# Patient Record
Sex: Female | Born: 1975
Health system: Southern US, Community
[De-identification: ages and names within clinical notes are randomized; demographics above are authoritative.]

## PROBLEM LIST (undated history)

## (undated) DIAGNOSIS — E559 Vitamin D deficiency, unspecified: Secondary | ICD-10-CM

## (undated) DIAGNOSIS — I639 Cerebral infarction, unspecified: Secondary | ICD-10-CM

## (undated) DIAGNOSIS — C801 Malignant (primary) neoplasm, unspecified: Secondary | ICD-10-CM

## (undated) HISTORY — DX: Malignant (primary) neoplasm, unspecified: C80.1

## (undated) HISTORY — DX: Vitamin D deficiency, unspecified: E55.9

## (undated) HISTORY — DX: Cerebral infarction, unspecified: I63.9

---

## 2001-12-09 ENCOUNTER — Inpatient Hospital Stay (HOSPITAL_COMMUNITY): Admission: AD | Admit: 2001-12-09 | Discharge: 2001-12-09 | Payer: Self-pay | Admitting: *Deleted

## 2006-09-06 ENCOUNTER — Emergency Department (HOSPITAL_COMMUNITY): Admission: EM | Admit: 2006-09-06 | Discharge: 2006-09-06 | Payer: Self-pay | Admitting: Emergency Medicine

## 2007-02-28 ENCOUNTER — Observation Stay: Payer: Self-pay

## 2007-03-01 ENCOUNTER — Observation Stay: Payer: Self-pay | Admitting: Obstetrics and Gynecology

## 2007-03-13 ENCOUNTER — Ambulatory Visit: Payer: Self-pay | Admitting: Obstetrics and Gynecology

## 2007-03-26 ENCOUNTER — Ambulatory Visit: Payer: Self-pay

## 2007-06-11 ENCOUNTER — Observation Stay: Payer: Self-pay | Admitting: Obstetrics and Gynecology

## 2007-06-26 ENCOUNTER — Inpatient Hospital Stay: Payer: Self-pay

## 2008-12-26 ENCOUNTER — Encounter: Admission: RE | Admit: 2008-12-26 | Discharge: 2008-12-26 | Payer: Self-pay | Admitting: Family Medicine

## 2009-01-01 ENCOUNTER — Encounter: Admission: RE | Admit: 2009-01-01 | Discharge: 2009-01-01 | Payer: Self-pay | Admitting: Family Medicine

## 2009-02-05 ENCOUNTER — Encounter (INDEPENDENT_AMBULATORY_CARE_PROVIDER_SITE_OTHER): Payer: Self-pay | Admitting: Surgery

## 2009-02-05 ENCOUNTER — Ambulatory Visit (HOSPITAL_COMMUNITY): Admission: RE | Admit: 2009-02-05 | Discharge: 2009-02-06 | Payer: Self-pay | Admitting: Surgery

## 2011-01-04 LAB — COMPREHENSIVE METABOLIC PANEL
ALT: 14 U/L (ref 0–35)
BUN: 12 mg/dL (ref 6–23)
CO2: 28 mEq/L (ref 19–32)
GFR calc Af Amer: >60 mL/min (ref >60)
GFR calc non Af Amer: >60 mL/min (ref >60)
Glucose, Bld: 83 mg/dL (ref 70–99)
Potassium: 4.3 mEq/L (ref 3.5–5.1)
Sodium: 141 mEq/L (ref 135–145)
Total Bilirubin: 1 mg/dL (ref 0.3–1.2)

## 2011-01-04 LAB — PREGNANCY, URINE: Preg Test, Ur: NEGATIVE

## 2011-01-04 LAB — DIFFERENTIAL
Lymphocytes Relative: 20 % (ref 12–46)
Lymphs Abs: 2.2 10*3/uL (ref 0.7–4.0)
Monocytes Absolute: 0.5 10*3/uL (ref 0.1–1.0)
Neutro Abs: 8.3 10*3/uL — ABNORMAL HIGH (ref 1.7–7.7)

## 2011-01-04 LAB — CBC
MCHC: 33.8 g/dL (ref 30.0–36.0)
MCV: 89.7 fL (ref 78.0–100.0)
Platelets: 204 10*3/uL (ref 150–400)

## 2011-01-12 ENCOUNTER — Other Ambulatory Visit: Payer: Self-pay | Admitting: Family Medicine

## 2011-01-12 DIAGNOSIS — R51 Headache: Secondary | ICD-10-CM

## 2011-02-08 NOTE — Op Note (Signed)
Vanessa Sharp, Vanessa Sharp             ACCOUNT NO.:  [id number]   MEDICAL RECORD NO.:  [id number]          PATIENT TYPE:  AMB   LOCATION:  DAY                          FACILITY:  [facility]   PHYSICIAN:  Sandria Bales. Ezzard Standing, M.D.  DATE OF BIRTH:  1976-06-15   DATE OF PROCEDURE:  02/05/2009  DATE OF DISCHARGE:                               OPERATIVE REPORT   Date of Surgery - 05 Feb 2009   PREOPERATIVE DIAGNOSES:  Chronic cholecystitis, cholelithiasis.   POSTOPERATIVE DIAGNOSES:  Chronic cholecystitis, cholelithiasis.   PROCEDURE:  Laparoscopic cholecystectomy with intraoperative  cholangiogram.   SURGEON  Ovidio Kin, M.D.   FIRST ASSISTANT:  Anselm Pancoast. Zachery Dakins, M.D.   ANESTHESIA:  General endotracheal.   ESTIMATED BLOOD LOSS:  Minimal.   INDICATIONS FOR PROCEDURE:  Ms. Minardi is a 35 year old white female  patient of Dr. Levonne Lapping who has had some vague epigastric abdominal  pain.  She had an ultrasound which showed multiple gallstones with the  largest being at least 1.5 cm.  I discussed with her about proceeding  with cholecystectomy and the indications and potential complications.   The potential complications of cholecystectomy include bleeding,  infection, bile duct injury, the possibility of open surgery and the  possibility her symptoms may continue postoperative.   OPERATIVE NOTE:  The patient was placed in the supine position and given  a general endotracheal anesthetic.  Her abdomen was prepped with  DuraPrep and sterilely draped.  She was given 1 gram of Ancef at  initiation of the procedure.  A time-out was held identifying the  patient and procedure.   I got to her intra-abdominal cavity using a 0 degree 10 mm laparoscope  through a 12 mm Hasson trocar secured with a 0 Vicryl suture at the  infraumbilical area.  I put a 10 mm trocar in the subxiphoid location, a  5 -mm right mid subcostal and a 5 mm lateral subcostal.   The gallbladder was grabbed, rotated  cephalad and dissection carried out  identifying the cystic duct and cystic artery.  The cystic artery was  doubly endoclipped and divided.  A clip was placed on the gallbladder  side of the cystic duct.  The triangle of Calot was visualized.   An intraoperative cholangiogram was shot using a cutoff taut catheter  inserted through a 14 inch Jelco catheter and into the side of the  cystic duct and secured with an Endo clip.  I used about 12 mL of half-  strength Hypaque solution showing contrast going down the cystic duct  into the common bile duct, into the duodenum, up the hepatic radicals.  There was no filling defect, no mass or obstruction and it was felt to  be a normal intraoperative cholangiogram.   The taut catheter was then removed.  The cystic duct was triply  endoclipped and divided.  Prior to complete division of the gallbladder  from the gallbladder bed, I revisualized the triangle of Calot.  There  was no bleeding or bile leak and then I used a hook Bovie to dissect the  gallbladder off the gallbladder bed.  After the gallbladder had been  dissected away, I placed it in an EndoCatch bag and delivered it through  the umbilicus.  I then reinspected the triangle of Calot, I reinspected  the gallbladder bed and again there was no bleeding or problem.   The patient tolerated the procedure well and was transported to the  recovery room in good condition.  Sponge and needle counts were correct  at the end of the case.      Sandria Bales. Ezzard Standing, M.D.  Electronically Signed     DHN/MEDQ  D:  02/05/2009  T:  02/05/2009  Job:  [id number]   cc:   Chales Salmon. Abigail Miyamoto, M.D.  Fax: [phone]   Madelin Headings  Fax: [phone]

## 2014-11-24 ENCOUNTER — Other Ambulatory Visit: Payer: Self-pay | Admitting: Physician Assistant

## 2014-11-25 ENCOUNTER — Other Ambulatory Visit: Payer: Self-pay | Admitting: Physician Assistant

## 2014-11-25 DIAGNOSIS — N644 Mastodynia: Secondary | ICD-10-CM

## 2014-11-26 ENCOUNTER — Other Ambulatory Visit: Payer: Self-pay | Admitting: Physician Assistant

## 2014-11-26 ENCOUNTER — Other Ambulatory Visit (HOSPITAL_COMMUNITY)
Admission: RE | Admit: 2014-11-26 | Discharge: 2014-11-26 | Disposition: A | Discharge status: Home / Self Care | Payer: BC Managed Care – PPO | Source: Ambulatory Visit | Attending: Physician Assistant | Admitting: Physician Assistant

## 2014-11-26 DIAGNOSIS — N644 Mastodynia: Secondary | ICD-10-CM

## 2014-12-04 ENCOUNTER — Ambulatory Visit
Admission: RE | Admit: 2014-12-04 | Discharge: 2014-12-04 | Disposition: A | Discharge status: Home / Self Care | Payer: BC Managed Care – PPO | Source: Ambulatory Visit | Attending: Physician Assistant | Admitting: Physician Assistant

## 2014-12-04 DIAGNOSIS — N644 Mastodynia: Secondary | ICD-10-CM

## 2014-12-15 ENCOUNTER — Other Ambulatory Visit: Payer: Self-pay | Admitting: Physician Assistant

## 2014-12-15 DIAGNOSIS — Z124 Encounter for screening for malignant neoplasm of cervix: Secondary | ICD-10-CM | POA: Diagnosis present

## 2014-12-15 DIAGNOSIS — R87810 Cervical high risk human papillomavirus (HPV) DNA test positive: Secondary | ICD-10-CM | POA: Insufficient documentation

## 2014-12-15 DIAGNOSIS — Z1151 Encounter for screening for human papillomavirus (HPV): Secondary | ICD-10-CM | POA: Diagnosis present

## 2014-12-16 LAB — CYTOLOGY - PAP

## 2015-01-15 ENCOUNTER — Other Ambulatory Visit: Payer: Self-pay | Admitting: Family Medicine

## 2016-02-18 ENCOUNTER — Other Ambulatory Visit (HOSPITAL_COMMUNITY)
Admission: RE | Admit: 2016-02-18 | Discharge: 2016-02-18 | Disposition: A | Discharge status: Home / Self Care | Payer: BC Managed Care – PPO | Source: Ambulatory Visit | Attending: Physician Assistant | Admitting: Physician Assistant

## 2016-02-18 ENCOUNTER — Other Ambulatory Visit: Payer: Self-pay | Admitting: Physician Assistant

## 2016-02-18 DIAGNOSIS — Z01411 Encounter for gynecological examination (general) (routine) with abnormal findings: Secondary | ICD-10-CM | POA: Diagnosis present

## 2016-02-18 DIAGNOSIS — Z1151 Encounter for screening for human papillomavirus (HPV): Secondary | ICD-10-CM | POA: Insufficient documentation

## 2016-02-24 LAB — CYTOLOGY - PAP

## 2016-03-23 ENCOUNTER — Other Ambulatory Visit: Payer: Self-pay | Admitting: Family Medicine

## 2016-11-11 MED FILL — OSELTAMIVIR PHOSPHATE 75 MG: 75 | 10 days supply | Qty: 10 | Fill #0

## 2017-04-28 ENCOUNTER — Other Ambulatory Visit: Payer: Self-pay | Admitting: Physician Assistant

## 2017-04-28 ENCOUNTER — Other Ambulatory Visit (HOSPITAL_COMMUNITY)
Admission: RE | Admit: 2017-04-28 | Discharge: 2017-04-28 | Disposition: A | Discharge status: Home / Self Care | Payer: BC Managed Care – PPO | Source: Ambulatory Visit | Attending: Physician Assistant | Admitting: Physician Assistant

## 2017-04-28 DIAGNOSIS — Z124 Encounter for screening for malignant neoplasm of cervix: Secondary | ICD-10-CM | POA: Insufficient documentation

## 2017-05-03 LAB — CYTOLOGY - PAP: HPV (WINDOPATH): DETECTED — AB

## 2017-05-10 ENCOUNTER — Other Ambulatory Visit: Payer: Self-pay | Admitting: Family Medicine

## 2017-05-10 DIAGNOSIS — Z1231 Encounter for screening mammogram for malignant neoplasm of breast: Secondary | ICD-10-CM

## 2017-05-22 ENCOUNTER — Ambulatory Visit
Admission: RE | Admit: 2017-05-22 | Discharge: 2017-05-22 | Disposition: A | Discharge status: Home / Self Care | Payer: BC Managed Care – PPO | Source: Ambulatory Visit | Attending: Family Medicine | Admitting: Family Medicine

## 2017-05-22 DIAGNOSIS — Z1231 Encounter for screening mammogram for malignant neoplasm of breast: Secondary | ICD-10-CM

## 2017-05-23 ENCOUNTER — Other Ambulatory Visit: Payer: Self-pay | Admitting: Family Medicine

## 2017-09-21 HISTORY — PX: ABDOMINAL HYSTERECTOMY: SHX81

## 2018-05-09 DIAGNOSIS — Z8541 Personal history of malignant neoplasm of cervix uteri: Secondary | ICD-10-CM | POA: Insufficient documentation

## 2018-08-10 ENCOUNTER — Other Ambulatory Visit: Payer: Self-pay | Admitting: Family Medicine

## 2018-08-10 DIAGNOSIS — Z1231 Encounter for screening mammogram for malignant neoplasm of breast: Secondary | ICD-10-CM

## 2018-09-24 ENCOUNTER — Encounter: Payer: Self-pay | Admitting: Radiology

## 2018-09-24 ENCOUNTER — Ambulatory Visit
Admission: RE | Admit: 2018-09-24 | Discharge: 2018-09-24 | Disposition: A | Discharge status: Home / Self Care | Payer: BC Managed Care – PPO | Source: Ambulatory Visit | Attending: Family Medicine | Admitting: Family Medicine

## 2018-09-24 DIAGNOSIS — Z1231 Encounter for screening mammogram for malignant neoplasm of breast: Secondary | ICD-10-CM

## 2018-11-08 ENCOUNTER — Emergency Department: Payer: BC Managed Care – PPO

## 2018-11-08 ENCOUNTER — Encounter: Payer: Self-pay | Admitting: Medical Oncology

## 2018-11-08 ENCOUNTER — Inpatient Hospital Stay (HOSPITAL_COMMUNITY): Payer: BC Managed Care – PPO | Admitting: Certified Registered Nurse Anesthetist

## 2018-11-08 ENCOUNTER — Inpatient Hospital Stay (HOSPITAL_COMMUNITY)
Admission: AD | Admit: 2018-11-08 | Discharge: 2018-11-11 | DRG: 023 | Disposition: A | Discharge status: Home / Self Care | Payer: BC Managed Care – PPO | Source: Other Acute Inpatient Hospital | Attending: Neurology | Admitting: Neurology

## 2018-11-08 ENCOUNTER — Emergency Department
Admission: EM | Admit: 2018-11-08 | Discharge: 2018-11-08 | Disposition: A | Discharge status: Other Acute Inpatient Hospital | Payer: BC Managed Care – PPO | Attending: Emergency Medicine | Admitting: Emergency Medicine

## 2018-11-08 ENCOUNTER — Inpatient Hospital Stay (HOSPITAL_COMMUNITY): Payer: BC Managed Care – PPO

## 2018-11-08 ENCOUNTER — Encounter (HOSPITAL_COMMUNITY)
Admission: AD | Disposition: A | Discharge status: Home / Self Care | Payer: Self-pay | Source: Other Acute Inpatient Hospital | Attending: Neurology

## 2018-11-08 DIAGNOSIS — Z79899 Other long term (current) drug therapy: Secondary | ICD-10-CM | POA: Insufficient documentation

## 2018-11-08 DIAGNOSIS — R4701 Aphasia: Secondary | ICD-10-CM | POA: Diagnosis present

## 2018-11-08 DIAGNOSIS — I7771 Dissection of carotid artery: Secondary | ICD-10-CM

## 2018-11-08 DIAGNOSIS — I959 Hypotension, unspecified: Secondary | ICD-10-CM | POA: Diagnosis present

## 2018-11-08 DIAGNOSIS — I63512 Cerebral infarction due to unspecified occlusion or stenosis of left middle cerebral artery: Principal | ICD-10-CM | POA: Diagnosis present

## 2018-11-08 DIAGNOSIS — I6602 Occlusion and stenosis of left middle cerebral artery: Secondary | ICD-10-CM | POA: Diagnosis not present

## 2018-11-08 DIAGNOSIS — E876 Hypokalemia: Secondary | ICD-10-CM | POA: Diagnosis not present

## 2018-11-08 DIAGNOSIS — I639 Cerebral infarction, unspecified: Secondary | ICD-10-CM

## 2018-11-08 DIAGNOSIS — R29702 NIHSS score 2: Secondary | ICD-10-CM | POA: Diagnosis not present

## 2018-11-08 DIAGNOSIS — I6522 Occlusion and stenosis of left carotid artery: Secondary | ICD-10-CM | POA: Diagnosis present

## 2018-11-08 DIAGNOSIS — Z90710 Acquired absence of both cervix and uterus: Secondary | ICD-10-CM

## 2018-11-08 DIAGNOSIS — E785 Hyperlipidemia, unspecified: Secondary | ICD-10-CM | POA: Diagnosis present

## 2018-11-08 DIAGNOSIS — G8321 Monoplegia of upper limb affecting right dominant side: Secondary | ICD-10-CM | POA: Diagnosis present

## 2018-11-08 DIAGNOSIS — Z803 Family history of malignant neoplasm of breast: Secondary | ICD-10-CM | POA: Diagnosis not present

## 2018-11-08 DIAGNOSIS — Z888 Allergy status to other drugs, medicaments and biological substances status: Secondary | ICD-10-CM | POA: Diagnosis not present

## 2018-11-08 DIAGNOSIS — D72829 Elevated white blood cell count, unspecified: Secondary | ICD-10-CM | POA: Diagnosis not present

## 2018-11-08 DIAGNOSIS — G43909 Migraine, unspecified, not intractable, without status migrainosus: Secondary | ICD-10-CM | POA: Diagnosis present

## 2018-11-08 DIAGNOSIS — R29704 NIHSS score 4: Secondary | ICD-10-CM | POA: Diagnosis present

## 2018-11-08 DIAGNOSIS — Z9282 Status post administration of tPA (rtPA) in a different facility within the last 24 hours prior to admission to current facility: Secondary | ICD-10-CM | POA: Diagnosis not present

## 2018-11-08 DIAGNOSIS — Z8541 Personal history of malignant neoplasm of cervix uteri: Secondary | ICD-10-CM

## 2018-11-08 DIAGNOSIS — R0683 Snoring: Secondary | ICD-10-CM | POA: Diagnosis present

## 2018-11-08 DIAGNOSIS — Z4659 Encounter for fitting and adjustment of other gastrointestinal appliance and device: Secondary | ICD-10-CM

## 2018-11-08 DIAGNOSIS — Z885 Allergy status to narcotic agent status: Secondary | ICD-10-CM | POA: Diagnosis not present

## 2018-11-08 DIAGNOSIS — Z79890 Hormone replacement therapy (postmenopausal): Secondary | ICD-10-CM | POA: Diagnosis not present

## 2018-11-08 HISTORY — PX: RADIOLOGY WITH ANESTHESIA: SHX6223

## 2018-11-08 LAB — COMPREHENSIVE METABOLIC PANEL
ALT: 15 U/L (ref 0–44)
AST: 19 U/L (ref 15–41)
Albumin: 4.8 g/dL (ref 3.5–5.0)
Alkaline Phosphatase: 53 U/L (ref 38–126)
Anion gap: 6 (ref 5–15)
BILIRUBIN TOTAL: 0.6 mg/dL (ref 0.3–1.2)
BUN: 15 mg/dL (ref 6–20)
CO2: 28 mmol/L (ref 22–32)
Calcium: 9.3 mg/dL (ref 8.9–10.3)
Chloride: 105 mmol/L (ref 98–111)
Creatinine, Ser: 0.64 mg/dL (ref 0.44–1.00)
GFR calc Af Amer: >60 mL/min (ref >60)
GFR calc non Af Amer: >60 mL/min (ref >60)
Glucose, Bld: 89 mg/dL (ref 70–99)
Potassium: 4.1 mmol/L (ref 3.5–5.1)
Sodium: 139 mmol/L (ref 135–145)
Total Protein: 7.4 g/dL (ref 6.5–8.1)

## 2018-11-08 LAB — DIFFERENTIAL
Abs Immature Granulocytes: 0.02 10*3/uL (ref 0.00–0.07)
Basophils Absolute: 0.1 10*3/uL (ref 0.0–0.1)
Basophils Relative: 1 %
Eosinophils Absolute: 0.2 10*3/uL (ref 0.0–0.5)
Eosinophils Relative: 2 %
Immature Granulocytes: 0 %
Lymphocytes Relative: 23 %
Lymphs Abs: 2.2 10*3/uL (ref 0.7–4.0)
MONOS PCT: 6 %
Monocytes Absolute: 0.6 10*3/uL (ref 0.1–1.0)
Neutro Abs: 6.3 10*3/uL (ref 1.7–7.7)
Neutrophils Relative %: 68 %

## 2018-11-08 LAB — CBC
HCT: 46 % (ref 36.0–46.0)
Hemoglobin: 15.1 g/dL — ABNORMAL HIGH (ref 12.0–15.0)
MCH: 29.2 pg (ref 26.0–34.0)
MCHC: 32.8 g/dL (ref 30.0–36.0)
MCV: 88.8 fL (ref 80.0–100.0)
Platelets: 221 10*3/uL (ref 150–400)
RBC: 5.18 MIL/uL — ABNORMAL HIGH (ref 3.87–5.11)
RDW: 12.5 % (ref 11.5–15.5)
WBC: 9.3 10*3/uL (ref 4.0–10.5)
nRBC: 0 % (ref 0.0–0.2)

## 2018-11-08 LAB — PROTIME-INR
INR: 1.01
Prothrombin Time: 13.2 seconds (ref 11.4–15.2)

## 2018-11-08 LAB — APTT: aPTT: 32 seconds (ref 24–36)

## 2018-11-08 LAB — GLUCOSE, CAPILLARY: Glucose-Capillary: 75 mg/dL (ref 70–99)

## 2018-11-08 SURGERY — IR WITH ANESTHESIA
Anesthesia: General

## 2018-11-08 MED ORDER — ALTEPLASE 100 MG IV SOLR
INTRAVENOUS | Status: AC
  Filled 2018-11-08: qty 100

## 2018-11-08 MED ORDER — VECURONIUM BROMIDE 10 MG IV SOLR
INTRAVENOUS | Status: DC | PRN
  Administered 2018-11-08: 2 mg via INTRAVENOUS

## 2018-11-08 MED ORDER — EPTIFIBATIDE 20 MG/10ML IV SOLN
INTRAVENOUS | Status: AC | PRN
  Administered 2018-11-08 (×3): 1.5 mg via INTRAVENOUS

## 2018-11-08 MED ORDER — PROPOFOL 10 MG/ML IV BOLUS
INTRAVENOUS | Status: DC | PRN
  Administered 2018-11-08: 160 mg via INTRAVENOUS

## 2018-11-08 MED ORDER — ASPIRIN 81 MG PO CHEW
CHEWABLE_TABLET | ORAL | Status: AC | PRN
  Administered 2018-11-08: 81 mg

## 2018-11-08 MED ORDER — TICAGRELOR 60 MG PO TABS
ORAL_TABLET | ORAL | Status: AC | PRN
  Administered 2018-11-08: 180 mg

## 2018-11-08 MED ORDER — CEFAZOLIN SODIUM-DEXTROSE 2-4 GM/100ML-% IV SOLN
INTRAVENOUS | Status: AC
  Filled 2018-11-08: qty 100

## 2018-11-08 MED ORDER — TIROFIBAN HCL IN NACL 5-0.9 MG/100ML-% IV SOLN
INTRAVENOUS | Status: AC
  Filled 2018-11-08: qty 100

## 2018-11-08 MED ORDER — EPTIFIBATIDE 20 MG/10ML IV SOLN
INTRAVENOUS | Status: AC
  Filled 2018-11-08: qty 10

## 2018-11-08 MED ORDER — NITROGLYCERIN 1 MG/10 ML FOR IR/CATH LAB
INTRA_ARTERIAL | Status: AC
  Filled 2018-11-08: qty 10

## 2018-11-08 MED ORDER — IOPAMIDOL (ISOVUE-370) INJECTION 76%
50.0000 mL | Freq: Once | INTRAVENOUS | Status: AC | PRN
  Administered 2018-11-08: 50 mL via INTRAVENOUS

## 2018-11-08 MED ORDER — PHENYLEPHRINE HCL 10 MG/ML IJ SOLN
INTRAMUSCULAR | Status: DC | PRN
  Administered 2018-11-08: 40 ug via INTRAVENOUS

## 2018-11-08 MED ORDER — ASPIRIN 325 MG PO TABS
ORAL_TABLET | ORAL | Status: AC
  Filled 2018-11-08: qty 1

## 2018-11-08 MED ORDER — ALTEPLASE (STROKE) FULL DOSE INFUSION
0.9000 mg/kg | Freq: Once | INTRAVENOUS | Status: AC
  Administered 2018-11-08: 71.9 mg via INTRAVENOUS
  Filled 2018-11-08: qty 100

## 2018-11-08 MED ORDER — LIDOCAINE HCL 1 % IJ SOLN
INTRAMUSCULAR | Status: AC
  Filled 2018-11-08: qty 20

## 2018-11-08 MED ORDER — FENTANYL CITRATE (PF) 100 MCG/2ML IJ SOLN
INTRAMUSCULAR | Status: AC
  Filled 2018-11-08: qty 2

## 2018-11-08 MED ORDER — LACTATED RINGERS IV SOLN
INTRAVENOUS | Status: DC | PRN
  Administered 2018-11-08 (×2): via INTRAVENOUS

## 2018-11-08 MED ORDER — ACETAMINOPHEN 160 MG/5ML PO SOLN: 650.0000 mg | ORAL | Status: DC | PRN

## 2018-11-08 MED ORDER — SODIUM CHLORIDE 0.9 % IV SOLN: INTRAVENOUS | Status: DC

## 2018-11-08 MED ORDER — SODIUM CHLORIDE 0.9 % IV SOLN
INTRAVENOUS | Status: DC | PRN
  Administered 2018-11-08: 25 ug/min via INTRAVENOUS

## 2018-11-08 MED ORDER — SODIUM CHLORIDE (PF) 0.9 % IJ SOLN
INTRAVENOUS | Status: AC | PRN
  Administered 2018-11-08 – 2018-11-09 (×7): 25 ug via INTRA_ARTERIAL

## 2018-11-08 MED ORDER — IOHEXOL 350 MG/ML SOLN
75.0000 mL | Freq: Once | INTRAVENOUS | Status: AC | PRN
  Administered 2018-11-08: 75 mL via INTRAVENOUS

## 2018-11-08 MED ORDER — CLOPIDOGREL BISULFATE 300 MG PO TABS
ORAL_TABLET | ORAL | Status: AC
  Filled 2018-11-08: qty 1

## 2018-11-08 MED ORDER — ACETAMINOPHEN 650 MG RE SUPP: 650.0000 mg | RECTAL | Status: DC | PRN

## 2018-11-08 MED ORDER — SUCCINYLCHOLINE 20MG/ML (10ML) SYRINGE FOR MEDFUSION PUMP - OPTIME
INTRAMUSCULAR | Status: DC | PRN
  Administered 2018-11-08: 140 mg via INTRAVENOUS

## 2018-11-08 MED ORDER — ACETAMINOPHEN 325 MG PO TABS: 650.0000 mg | ORAL_TABLET | ORAL | Status: DC | PRN

## 2018-11-08 MED ORDER — CEFAZOLIN SODIUM-DEXTROSE 2-3 GM-%(50ML) IV SOLR
INTRAVENOUS | Status: DC | PRN
  Administered 2018-11-08: 2 g via INTRAVENOUS

## 2018-11-08 MED ORDER — SODIUM CHLORIDE 0.9% FLUSH: 3.0000 mL | Freq: Once | INTRAVENOUS | Status: DC

## 2018-11-08 MED ORDER — SODIUM CHLORIDE 0.9 % IV SOLN
50.0000 mL | Freq: Once | INTRAVENOUS | Status: AC
  Administered 2018-11-08: 50 mL via INTRAVENOUS

## 2018-11-08 MED ORDER — TICAGRELOR 90 MG PO TABS
ORAL_TABLET | ORAL | Status: AC
  Filled 2018-11-08: qty 2

## 2018-11-08 MED ORDER — STROKE: EARLY STAGES OF RECOVERY BOOK
Freq: Once | Status: DC
  Filled 2018-11-08 (×2): qty 1

## 2018-11-08 MED ORDER — PANTOPRAZOLE SODIUM 40 MG IV SOLR
40.0000 mg | Freq: Every day | INTRAVENOUS | Status: DC
  Administered 2018-11-09: 40 mg via INTRAVENOUS
  Filled 2018-11-08: qty 40

## 2018-11-08 MED ORDER — ASPIRIN 81 MG PO CHEW
CHEWABLE_TABLET | ORAL | Status: AC
  Filled 2018-11-08: qty 1

## 2018-11-08 MED ORDER — ALTEPLASE (STROKE) FULL DOSE INFUSION: 0.9000 mg/kg | Freq: Once | INTRAVENOUS | Status: DC

## 2018-11-08 MED ORDER — SENNOSIDES-DOCUSATE SODIUM 8.6-50 MG PO TABS: 1.0000 | ORAL_TABLET | Freq: Every evening | ORAL | Status: DC | PRN

## 2018-11-08 MED ORDER — FENTANYL CITRATE (PF) 100 MCG/2ML IJ SOLN
INTRAMUSCULAR | Status: DC | PRN
  Administered 2018-11-08: 100 ug via INTRAVENOUS
  Administered 2018-11-09: 50 ug via INTRAVENOUS

## 2018-11-08 MED ORDER — ROCURONIUM 10MG/ML (10ML) SYRINGE FOR MEDFUSION PUMP - OPTIME
INTRAVENOUS | Status: DC | PRN
  Administered 2018-11-08: 50 mg via INTRAVENOUS
  Administered 2018-11-08: 10 mg via INTRAVENOUS
  Administered 2018-11-08: 20 mg via INTRAVENOUS
  Administered 2018-11-08 (×2): 10 mg via INTRAVENOUS

## 2018-11-08 NOTE — ED Notes (Signed)
ED Provider at bedside. 

## 2018-11-08 NOTE — ED Notes (Signed)
Dr. Denzil Magnuson on screen at this time.

## 2018-11-08 NOTE — ED Notes (Signed)
tPa Bolus completed.

## 2018-11-08 NOTE — ED Triage Notes (Signed)
Pt to ED from home with husband who reports when he arrived home around 1600 pt was unable to speak and was confused. Pt last spoke to husband on the phone at 1300 with normal speech and A/O. Pt was able to tell me her first name in triage however slurred, pt was unable to answer any other questions. Follows commands. Grip strengths weak bilaterally.

## 2018-11-08 NOTE — ED Provider Notes (Signed)
[facility] Emergency Department Provider Note ____________________________________________   I have reviewed the triage vital signs and the nursing notes.   HISTORY  Chief Complaint Aphasia   History limited by and level 5 caveat due to aphasia, most history obtained from husband  HPI Vanessa Sharp is a 43 y.o. female who presents to the emergency department today because of concern for aphagia. The patient was last spoken to by the husband around 1 pm today.  Husband noticed when he talk to her later in the day that she was having trouble speaking.  By the time of my exam the husband states that her speech has improved although patient is still not at baseline.  Patient herself denies any weakness in her extremities.  Denies any headaches.   History reviewed. No pertinent past medical history.  There are no active problems to display for this patient.   Prior to Admission medications   Medication Sig Start Date End Date Taking? Authorizing Provider  Cholecalciferol (VITAMIN D-1000 MAX ST) 25 MCG (1000 UT) tablet Take 1,000 mg by mouth daily.   Yes [provider]  ferrous sulfate 325 (65 FE) MG tablet Take 325 mg by mouth daily.   Yes [provider]  Melatonin 10 MG CAPS Take 10 mg by mouth Nightly.   Yes [provider]  Multiple Vitamin (MULTI-VITAMIN DAILY) TABS Take 1 tablet by mouth daily.   Yes [provider]  rizatriptan (MAXALT) 10 MG tablet Take 10 mg by mouth once as needed. 05/29/17  Yes [provider]    Allergies Hydrocodone and Pseudoephedrine hcl  Family History  Problem Relation Age of Onset  . Breast cancer Mother     Social History Social History   Tobacco Use  . Smoking status: Not on file  Substance Use Topics  . Alcohol use: Not on file  . Drug use: Not on file    Review of Systems Unable to obtain full ROS secondary to aphasia. Patient denies focal extremity  weakness.  ____________________________________________   PHYSICAL EXAM:  VITAL SIGNS: ED Triage Vitals [11/08/18 1640]  Enc Vitals Group     BP 139/79     Pulse Rate 84     Resp 16     Temp 98.2 F (36.8 C)     Temp Source Oral     SpO2 100 %   Constitutional: Awake and alert. Aphasic.  Eyes: Conjunctivae are normal.  ENT      Head: Normocephalic and atraumatic.      Nose: No congestion/rhinnorhea.      Mouth/Throat: Mucous membranes are moist.      Neck: No stridor. Cardiovascular: Normal rate, regular rhythm. Respiratory: Normal respiratory effort without tachypnea nor retractions.  Genitourinary: Deferred Musculoskeletal: Normal range of motion in all extremities.  Neurologic:  Aphagia.  Skin:  Skin is warm, dry and intact. No rash noted. 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   ____________________________________________   PROCEDURES  Procedures  CRITICAL CARE Performed by: Nance Pear   Total critical care time: 45 minutes  Critical care time was exclusive of separately billable procedures and treating other patients.  Critical care was necessary to treat or prevent imminent or life-threatening  deterioration.  Critical care was time spent personally by me on the following activities: development of treatment plan with patient and/or surrogate as well as nursing, discussions with consultants, evaluation of patient's response to treatment, examination of patient, obtaining history from patient or surrogate, ordering and performing treatments and interventions, ordering and review of laboratory studies, ordering and review of radiographic studies, pulse oximetry and re-evaluation of patient's condition.  ____________________________________________   INITIAL IMPRESSION / ASSESSMENT AND PLAN / ED COURSE  Pertinent labs & imaging results that were available during my care of the patient were reviewed by me and considered in my medical decision making (see chart for details).   Patient presented to the emergency department today, to by husband because of concerns for aphasia.  Patient last known normal was around 1:00.  Patient was called a code stroke.  Tele-neurology did evaluate the patient.  Head CT did come back concerning for acute left sided MCA stroke.  Tele-neurologist had a discussion with the patient and family as did I about risks and benefits of TPA.  Family elected to proceed with TPA.  Patient then had CT angios performed which was concerning for ICA dissection and M2 occlusion.  Discussed this with Dr. Rory Percy with neurology at [place].  Decision was made to transfer patient to [facility] for possible thrombectomy.  Discussed this plan with the patient and family.    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 Any transcriptional errors that result from this process are unintentional     Nance Pear, MD 11/09/18 1457

## 2018-11-08 NOTE — ED Notes (Signed)
This RN attempted to call report to interventional radiology with no answer. This RN was forwarded to voicemail and unable to reach RN to give report. [facility] has left at this time with patient. [facility] RN given report. Pt belongings sent with family member.

## 2018-11-08 NOTE — ED Notes (Signed)
tPa bolus initiated

## 2018-11-08 NOTE — ED Notes (Signed)
EMTALA reviewed by this RN.  

## 2018-11-08 NOTE — Progress Notes (Signed)
CODE STROKE- PHARMACY COMMUNICATION   Time CODE STROKE called/page received: 1641  Time response to CODE STROKE was made (in person or via phone):   Time Stroke Kit retrieved from [place] (only if needed): ~1700  Name of Provider/Nurse contacted: Dr. Archie Balboa and RN.  Assitied RN with mixing medication and discussion of expected dosing and administration.  No past medical history on file. Prior to Admission medications   Not on [place] ,PharmD Clinical Pharmacist  11/08/2018  4:44 PM

## 2018-11-08 NOTE — ED Notes (Signed)
Stroke swallow screen unable to be completed at this time. Pt to be kept NPO per Dr. Archie Balboa at this time.

## 2018-11-08 NOTE — ED Notes (Signed)
Teleneuro nurse on screen at this time. 

## 2018-11-08 NOTE — Consult Note (Addendum)
[facility] TeleNeurology Consult Services   Date of Service:   11/08/2018 16:53:46  Impression:     .  RO Acute Ischemic Stroke  Comments: acute onset isolated aphasia - LSN at approx 1300. Initial head CT read as ASPECTS of 6, but there is no extensive changes of clear hypoattenuation on  CT. The risks/benefits/alternatives of IV tpa, including a 6 percent risk of brain bleed and 3 percent risk of death, was reviewed with the patient's husband and mother, and they consented to IV tpa.  Mechanism of Stroke: Possible Thromboembolic Possible Cardioembolic Small Vessel Disease  Metrics: Last Known Well: 11/08/2018 13:00:00 [facility] Notification Time: 11/08/2018 16:53:46 Arrival Time: 11/08/2018 16:34:00 Stamp Time: 11/08/2018 16:53:46 Time First Login Attempt: 11/08/2018 17:01:37 Video Start Time: 11/08/2018 17:01:37  Symptoms: difficulty speaking NIHSS Start Assessment Time: 11/08/2018 17:05:22 tPA Verbal Order Time: 11/08/2018 17:05:38 Patient is a candidate for tPA. tPA CPOE Order Time: 11/08/2018 17:26:12 Needle Time: 11/08/2018 17:27:25 Weight Noted by Staff: 79.9 kg Video End Time: 11/08/2018 17:31:11  ED Physician notified of diagnostic impression and management plan on 11/08/2018 17:31:02  Verbal Consent to tPA: I have explained to the Family the nature of the patient's condition, the use of tPA fibrinolytic agent, and the benefits to be reasonably expected compared with alternative approaches. I have discussed the likelihood of major risks or complications of this procedure including (if applicable) but not limited to loss of limb function, brain damage, paralysis, hemorrhage, infection, complications from transfusion of blood components, drug reactions, blood clots and loss of life. I have also indicated that with any procedure there is always the possibility of an unexpected complication. All questions were answered and Family express  understanding of the treatment plan and consent to the treatment.  Our recommendations are outlined below.  Recommendations: IV tPA recommended.  tPA bolus given Without Complication.   IV tPA Total Dose - 71.9 mg IV tPA Bolus Dose - 7.2 mg IV tPA Infusion Dose - 64.7 mg  Routine post tPA monitoring including neuro checks and blood pressure control during/after treatment Monitor blood pressure Check blood pressure and NIHSS every 15 min for 2 h, then every 30 min for 6 h, and finally every hour for 16 h.  Manage Blood Pressure per post tPA protocol.      .  Admission to ICU     .  CT brain 24 hours post tPA     .  NPO until swallowing screen performed and passed     .  No antiplatelet agents or anticoagulants (including heparin for DVT prophylaxis) in first 24 hours     .  No Foley catheter, nasogastric tube, arterial catheter or central venous catheter for 24 hr, unless absolutely necessary     .  Telemetry     .  Bedside swallow evaluation     .  HOB less than 30 degrees     .  Euglycemia     .  Avoid hyperthermia, PRN acetaminophen     .  DVT prophylaxis     .  Inpatient Neurology Consultation     .  Stroke evaluation as per inpatient neurology recommendations  Discussed with ED physician  Addendum: CTA head/neck shows L ICA occlusion - she is in the process of being transferred for endovascular evaluation.   ------------------------------------------------------------------------------  History of Present Illness: Patient is a 43 years old Female.  Patient was brought by private transportation with symptoms of difficulty speaking  43 yo woman last spoke  with her husband on the phone at approx 1300. He came home at approx 1600, and she is unable to speak. BG 75.  Last seen normal was within 4.5 hours. There is no history of hemorrhagic complications or intracranial hemorrhage. There is no history of Recent Anticoagulants. There is no history of recent major  surgery. There is no history of recent stroke.  Examination: BP(146/87), Blood Glucose(75) 1A: Level of Consciousness - Alert; keenly responsive + 0 1B: Ask Month and Age - Aphasic + 2 1C: Blink Eyes & Squeeze Hands - Performs 1 Task + 1 2: Test Horizontal Extraocular Movements - Normal + 0 3: Test Visual Fields - No Visual Loss + 0 4: Test Facial Palsy (Use Grimace if Obtunded) - Normal symmetry + 0 5A: Test Left Arm Motor Drift - No Drift for 10 Seconds + 0 5B: Test Right Arm Motor Drift - No Drift for 10 Seconds + 0 6A: Test Left Leg Motor Drift - No Drift for 5 Seconds + 0 6B: Test Right Leg Motor Drift - No Drift for 5 Seconds + 0 7: Test Limb Ataxia (FNF/Heel-Shin) - No Ataxia + 0 8: Test Sensation - Normal; No sensory loss + 0 9: Test Language/Aphasia - Severe Aphasia: Fragmentary Expression, Inference Needed, Cannot Identify Materials + 2 10: Test Dysarthria - Normal + 0 11: Test Extinction/Inattention - No abnormality + 0  NIHSS Score: 5  Patient was informed the Neurology Consult would happen via TeleHealth consult by way of interactive audio and video telecommunications and consented to receiving care in this manner.  Due to the immediate potential for life-threatening deterioration due to underlying acute neurologic illness, I spent 35 minutes providing critical care. This time includes time for face to face visit via telemedicine, review of medical records, imaging studies and discussion of findings with providers, the patient and/or family.   Dr Hal Morales   [facility] [phone]  Case [id number]

## 2018-11-08 NOTE — ED Notes (Signed)
Unable to get 18G IV at this time. MD Archie Balboa aware. MD Archie Balboa going to attempt Ultrasound IV insertion for CT Perfusion scan.

## 2018-11-08 NOTE — ED Notes (Signed)
Pt returned from CT scan at this time.
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
## 2018-11-08 NOTE — Anesthesia Procedure Notes (Signed)
Procedure Name: Intubation Date/Time: 11/08/2018 9:54 PM Performed by: Valetta Fuller, CRNA Pre-anesthesia Checklist: Patient identified, Emergency Drugs available, Suction available and Patient being monitored Patient Re-evaluated:Patient Re-evaluated prior to induction Oxygen Delivery Method: Circle system utilized Preoxygenation: Pre-oxygenation with 100% oxygen Induction Type: IV induction, Rapid sequence and Cricoid Pressure applied Laryngoscope Size: Miller and 2 Grade View: Grade I Tube type: Oral Tube size: 7.5 mm Number of attempts: 1 Airway Equipment and Method: Stylet Placement Confirmation: ETT inserted through vocal cords under direct vision,  positive ETCO2 and breath sounds checked- equal and bilateral Secured at: 23 cm Tube secured with: Tape Dental Injury: Teeth and Oropharynx as per pre-operative assessment

## 2018-11-08 NOTE — ED Notes (Signed)
Patient transported to CT 

## 2018-11-08 NOTE — ED Notes (Signed)
[facility]:  To transfer to IR suite [facility]

## 2018-11-08 NOTE — Anesthesia Procedure Notes (Signed)
Arterial Line Insertion Start/End2/13/2020 10:05 PM, 11/08/2018 10:15 PM Performed by: Oletta Lamas, CRNA, CRNA  Patient location: OR. Left, radial was placed Catheter size: 20 G Hand hygiene performed  and maximum sterile barriers used   Attempts: 2 Procedure performed without using ultrasound guided technique. Following insertion, dressing applied and Biopatch. Post procedure assessment: normal

## 2018-11-08 NOTE — Progress Notes (Signed)
   11/08/18 2000  Clinical Encounter Type  Visited With Family;Patient and family together  Visit Type Initial;Spiritual support;Code;ED  Referral From Nurse  Spiritual Encounters  Spiritual Needs Prayer;Emotional;Grief support  Stress Factors  Patient Stress Factors Loss;Loss of control;Major life changes;Other (Comment)  Family Stress Factors Exhausted;Loss

## 2018-11-08 NOTE — Anesthesia Preprocedure Evaluation (Signed)
Anesthesia Evaluation  Patient identified by MRN, date of birth, ID band Patient awake  Preop documentation limited or incomplete due to emergent nature of procedure.  Airway Mallampati: II       Dental   Pulmonary    breath sounds clear to auscultation       Cardiovascular  Rate:Normal     Neuro/Psych    GI/Hepatic   Endo/Other    Renal/GU      Musculoskeletal   Abdominal   Peds  Hematology   Anesthesia Other Findings   Reproductive/Obstetrics                             Anesthesia Physical Anesthesia Plan  ASA: II  Anesthesia Plan: General   Post-op Pain Management:    Induction: Intravenous  PONV Risk Score and Plan: 3 and Treatment may vary due to age or medical condition  Airway Management Planned: Oral ETT  Additional Equipment:   Intra-op Plan:   Post-operative Plan: Possible Post-op intubation/ventilation  Informed Consent: I have reviewed the patients History and Physical, chart, labs and discussed the procedure including the risks, benefits and alternatives for the proposed anesthesia with the patient or authorized representative who has indicated his/her understanding and acceptance.     Dental advisory given  Plan Discussed with: CRNA and Anesthesiologist  Anesthesia Plan Comments:         Anesthesia Quick Evaluation

## 2018-11-08 NOTE — ED Notes (Signed)
Pharmacist at bedside.

## 2018-11-08 NOTE — ED Notes (Signed)
Pt transported to CT ?

## 2018-11-08 NOTE — ED Notes (Signed)
tPa Complete.

## 2018-11-09 ENCOUNTER — Inpatient Hospital Stay (HOSPITAL_COMMUNITY): Payer: BC Managed Care – PPO

## 2018-11-09 ENCOUNTER — Encounter (HOSPITAL_COMMUNITY): Payer: Self-pay | Admitting: Interventional Radiology

## 2018-11-09 DIAGNOSIS — I639 Cerebral infarction, unspecified: Secondary | ICD-10-CM

## 2018-11-09 DIAGNOSIS — I6602 Occlusion and stenosis of left middle cerebral artery: Secondary | ICD-10-CM | POA: Diagnosis present

## 2018-11-09 HISTORY — PX: IR ANGIO INTRA EXTRACRAN SEL COM CAROTID INNOMINATE UNI R MOD SED: IMG5359

## 2018-11-09 HISTORY — PX: IR INTRA CRAN STENT: IMG2345

## 2018-11-09 HISTORY — PX: IR INTRAVSC STENT CERV CAROTID W/O EMB-PROT MOD SED INC ANGIO: IMG2304

## 2018-11-09 HISTORY — PX: IR PERCUTANEOUS ART THROMBECTOMY/INFUSION INTRACRANIAL INC DIAG ANGIO: IMG6087

## 2018-11-09 LAB — LIPID PANEL
CHOLESTEROL: 133 mg/dL (ref 0–200)
HDL: 47 mg/dL (ref >40)
LDL Cholesterol: 79 mg/dL (ref 0–99)
Total CHOL/HDL Ratio: 2.8 RATIO
Triglycerides: 37 mg/dL (ref <150)
VLDL: 7 mg/dL (ref 0–40)

## 2018-11-09 LAB — HEMOGLOBIN A1C
Hgb A1c MFr Bld: 5.1 % (ref 4.8–5.6)
Mean Plasma Glucose: 99.67 mg/dL

## 2018-11-09 LAB — BASIC METABOLIC PANEL
Anion gap: 7 (ref 5–15)
BUN: 7 mg/dL (ref 6–20)
CHLORIDE: 113 mmol/L — AB (ref 98–111)
CO2: 22 mmol/L (ref 22–32)
Calcium: 8.2 mg/dL — ABNORMAL LOW (ref 8.9–10.3)
Creatinine, Ser: 0.57 mg/dL (ref 0.44–1.00)
GFR calc Af Amer: >60 mL/min (ref >60)
GFR calc non Af Amer: >60 mL/min (ref >60)
Glucose, Bld: 138 mg/dL — ABNORMAL HIGH (ref 70–99)
Potassium: 3.7 mmol/L (ref 3.5–5.1)
Sodium: 142 mmol/L (ref 135–145)

## 2018-11-09 LAB — CBC WITH DIFFERENTIAL/PLATELET
Abs Immature Granulocytes: 0 10*3/uL (ref 0.00–0.07)
Basophils Absolute: 0 10*3/uL (ref 0.0–0.1)
Basophils Relative: 0 %
Eosinophils Absolute: 0 10*3/uL (ref 0.0–0.5)
Eosinophils Relative: 0 %
HCT: 40 % (ref 36.0–46.0)
HEMOGLOBIN: 12.8 g/dL (ref 12.0–15.0)
Lymphocytes Relative: 1 %
Lymphs Abs: 0.1 10*3/uL — ABNORMAL LOW (ref 0.7–4.0)
MCH: 28.7 pg (ref 26.0–34.0)
MCHC: 32 g/dL (ref 30.0–36.0)
MCV: 89.7 fL (ref 80.0–100.0)
MONO ABS: 0 10*3/uL — AB (ref 0.1–1.0)
MONOS PCT: 0 %
NRBC: 0 /100{WBCs}
Neutro Abs: 11 10*3/uL — ABNORMAL HIGH (ref 1.7–7.7)
Neutrophils Relative %: 99 %
Platelets: 181 10*3/uL (ref 150–400)
RBC: 4.46 MIL/uL (ref 3.87–5.11)
RDW: 12.5 % (ref 11.5–15.5)
WBC: 11.1 10*3/uL — AB (ref 4.0–10.5)
nRBC: 0 % (ref 0.0–0.2)

## 2018-11-09 LAB — HIV ANTIBODY (ROUTINE TESTING W REFLEX): HIV Screen 4th Generation wRfx: NONREACTIVE

## 2018-11-09 LAB — MRSA PCR SCREENING: MRSA by PCR: NEGATIVE

## 2018-11-09 LAB — ECHOCARDIOGRAM COMPLETE: Weight: 2818.36 oz

## 2018-11-09 LAB — GLUCOSE, CAPILLARY: Glucose-Capillary: 127 mg/dL — ABNORMAL HIGH (ref 70–99)

## 2018-11-09 LAB — PLATELET INHIBITION P2Y12: Platelet Function  P2Y12: 277 [PRU] (ref 182–335)

## 2018-11-09 MED ORDER — SODIUM CHLORIDE 0.9 % IV BOLUS
500.0000 mL | Freq: Once | INTRAVENOUS | Status: AC
  Administered 2018-11-09: 500 mL via INTRAVENOUS

## 2018-11-09 MED ORDER — LABETALOL HCL 5 MG/ML IV SOLN
INTRAVENOUS | Status: DC | PRN
  Administered 2018-11-09 (×2): 5 mg via INTRAVENOUS
  Administered 2018-11-09: 10 mg via INTRAVENOUS

## 2018-11-09 MED ORDER — PROMETHAZINE HCL 25 MG/ML IJ SOLN
6.2500 mg | Freq: Once | INTRAMUSCULAR | Status: AC
  Administered 2018-11-09: 6.25 mg via INTRAVENOUS

## 2018-11-09 MED ORDER — IOHEXOL 300 MG/ML  SOLN
160.0000 mL | Freq: Once | INTRAMUSCULAR | Status: AC | PRN
  Administered 2018-11-09: 160 mL via INTRA_ARTERIAL

## 2018-11-09 MED ORDER — DEXAMETHASONE SODIUM PHOSPHATE 10 MG/ML IJ SOLN
INTRAMUSCULAR | Status: DC | PRN
  Administered 2018-11-09: 10 mg via INTRAVENOUS

## 2018-11-09 MED ORDER — ACETAMINOPHEN 650 MG RE SUPP: 650.0000 mg | RECTAL | Status: DC | PRN

## 2018-11-09 MED ORDER — ATORVASTATIN CALCIUM 40 MG PO TABS
40.0000 mg | ORAL_TABLET | Freq: Every day | ORAL | Status: DC
  Administered 2018-11-09 – 2018-11-10 (×2): 40 mg via ORAL
  Filled 2018-11-09 (×2): qty 1

## 2018-11-09 MED ORDER — ASPIRIN 81 MG PO CHEW
81.0000 mg | CHEWABLE_TABLET | Freq: Every day | ORAL | Status: DC
  Administered 2018-11-09 – 2018-11-10 (×2): 81 mg via ORAL
  Filled 2018-11-09: qty 1

## 2018-11-09 MED ORDER — PROMETHAZINE HCL 25 MG/ML IJ SOLN
INTRAMUSCULAR | Status: AC
  Filled 2018-11-09: qty 1

## 2018-11-09 MED ORDER — ASPIRIN 81 MG PO CHEW
81.0000 mg | CHEWABLE_TABLET | Freq: Every day | ORAL | Status: DC
  Filled 2018-11-09: qty 1

## 2018-11-09 MED ORDER — TICAGRELOR 90 MG PO TABS
90.0000 mg | ORAL_TABLET | Freq: Two times a day (BID) | ORAL | Status: DC
  Filled 2018-11-09 (×2): qty 1

## 2018-11-09 MED ORDER — SUGAMMADEX SODIUM 200 MG/2ML IV SOLN
INTRAVENOUS | Status: DC | PRN
  Administered 2018-11-09: 350 mg via INTRAVENOUS

## 2018-11-09 MED ORDER — TICAGRELOR 90 MG PO TABS
90.0000 mg | ORAL_TABLET | Freq: Two times a day (BID) | ORAL | Status: DC
  Administered 2018-11-09 – 2018-11-10 (×3): 90 mg via ORAL
  Filled 2018-11-09 (×2): qty 1

## 2018-11-09 MED ORDER — ACETAMINOPHEN 325 MG PO TABS: 650.0000 mg | ORAL_TABLET | ORAL | Status: DC | PRN

## 2018-11-09 MED ORDER — CLEVIDIPINE BUTYRATE 0.5 MG/ML IV EMUL
0.0000 mg/h | INTRAVENOUS | Status: DC
  Administered 2018-11-09: 1 mg/h via INTRAVENOUS
  Filled 2018-11-09: qty 100

## 2018-11-09 MED ORDER — ONDANSETRON HCL 4 MG/2ML IJ SOLN
INTRAMUSCULAR | Status: DC | PRN
  Administered 2018-11-09: 4 mg via INTRAVENOUS

## 2018-11-09 MED ORDER — SODIUM CHLORIDE 0.9 % IV SOLN
INTRAVENOUS | Status: DC
  Administered 2018-11-09 (×2): via INTRAVENOUS

## 2018-11-09 MED ORDER — ACETAMINOPHEN 160 MG/5ML PO SOLN: 650.0000 mg | ORAL | Status: DC | PRN

## 2018-11-09 NOTE — Progress Notes (Signed)
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 Estanislado Pandy. Patient awake and alert laying in bed with no complaints at this time. Accompanied by husband at bedside. Can spontaneously move all extremities. Speech clear. Right groin incision c/d/i with sheath in place.   Allergies: Hydrocodone and Pseudoephedrine hcl  Medications: Prior to Admission medications   Medication Sig Start Date End Date Taking? Authorizing Provider  Cholecalciferol (VITAMIN D-1000 MAX ST) 25 MCG (1000 UT) tablet Take 1,000 mg by mouth daily.    [provider]  ferrous sulfate 325 (65 FE) MG tablet Take 325 mg by mouth daily.    [provider]  Melatonin 10 MG CAPS Take 10 mg by mouth Nightly.    [provider]  Multiple Vitamin (MULTI-VITAMIN DAILY) TABS Take 1 tablet by mouth daily.    [provider]  rizatriptan (MAXALT) 10 MG tablet Take 10 mg by mouth once as needed. 05/29/17   [provider]     Vital Signs: BP 113/62   Pulse (!) 103   Temp 98.8 F (37.1 C) (Oral)   Resp 17   LMP 05/15/2017   SpO2 100%   Physical Exam Vitals signs and nursing note reviewed.  Constitutional:      General: She is not in acute distress.    Appearance: Normal appearance.  Pulmonary:     Effort: Pulmonary effort is normal. No respiratory distress.  Skin:    General: Skin is warm and dry.     Comments: Right groin incision soft with sheath intact, no active bleeding or hematoma.  Neurological:     Mental Status: She is alert.     Comments: Alert, awake, and oriented x3. Speech and comprehension intact. PERRL bilaterally. EOMs intact bilaterally without nystagmus or subjective diplopia. Visual fields not  assessed. No facial asymmetry. Tongue midline. Can spontaneously move all extremities. No pronator drift. Fine motor and coordination intact and symmetric. Gait not assessed. Romberg not assessed. Heel to toe not assessed. Distal pulses 2+ bilaterally.  Psychiatric:        Mood and Affect: Mood normal.        Behavior: Behavior normal.        Thought Content: Thought content normal.        Judgment: Judgment normal.     Imaging: Ct Angio Head W Or Wo Contrast  Result Date: 11/08/2018 CLINICAL DATA:  43 y/o  F; code stroke, aphasia. EXAM: CT ANGIOGRAPHY HEAD AND NECK TECHNIQUE: Multidetector CT imaging of the head and neck was performed using the standard protocol during bolus administration of intravenous contrast. Multiplanar CT image reconstructions and MIPs were obtained to evaluate the vascular anatomy. Carotid stenosis measurements (when applicable) are obtained utilizing NASCET criteria, using the distal internal carotid diameter as the denominator. CONTRAST:  28mL OMNIPAQUE IOHEXOL 350 MG/ML SOLN COMPARISON:  11/08/2018 CT head FINDINGS: CTA NECK FINDINGS Aortic arch: Standard branching. Imaged portion shows no evidence of aneurysm or dissection. No significant stenosis of the major arch vessel origins. Right carotid system: No evidence of dissection, stenosis (50% or greater) or occlusion. Left carotid system: Patent left common carotid artery. 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 Occlusion of the left internal carotid artery from the C2 level  to distal cavernous segment. Vertebral arteries: Codominant. No evidence of dissection, stenosis (50% or greater) or occlusion. Skeleton: Mild reversal of cervical curvature and multilevel discogenic degenerative changes. No acute osseous abnormality. No high-grade bony spinal canal stenosis. Other neck: Negative Upper chest: Negative. Review of the MIP images confirms the above  findings CTA HEAD FINDINGS Anterior circulation: Occlusion of the left ICA from the C2 level to the distal cavernous segment. Patent paraclinoid and terminal left ICA, patent left PCOM, patent left ophthalmic artery. Occlusion of a left M2 anterolateral frontal branch (series 8, image 107). No additional area of significant stenosis, proximal occlusion, aneurysm, or vascular malformation. Posterior circulation: No significant stenosis, proximal occlusion, aneurysm, or vascular malformation. Venous sinuses: As permitted by contrast timing, patent. Anatomic variants: Fetal left PCA. No right posterior communicating artery identified, likely hypoplastic or absent. Patent anterior communicating artery. Review of the MIP images confirms the above findings IMPRESSION: CTA neck: 1. 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 Occlusion of left ICA from C2 to the distal cavernous segment. 2. No additional occlusion, dissection, aneurysm, or hemodynamically significant stenosis by NASCET criteria. CTA head: 1. Occlusion of left ICA from C2 level to the distal cavernous segment. 2. Occlusion of a left M2 anterolateral frontal branch. 3. Otherwise negative CTA of the head and neck. No additional large vessel occlusion, aneurysm, or significant stenosis. These results were called by telephone at the time of interpretation on 11/08/2018 at 7:35 pm to Dr. Archie Balboa, who verbally acknowledged these results. Electronically Signed   By: Kristine Garbe M.D.   On: 11/08/2018 19:40   Ct Head Wo Contrast  Result Date: 11/09/2018 CLINICAL DATA:  Altered level of consciousness. EXAM: CT HEAD WITHOUT CONTRAST TECHNIQUE: Contiguous axial images were obtained from the base of the skull through the vertex without intravenous contrast. COMPARISON:  Head CT from yesterday FINDINGS: Brain: Cytotoxic edema in the left insula, frontal operculum, and now seen at the caudate  head. No hemorrhagic complication. No significant mass effect. Vascular: No hyperdense vessel. Left ICA stent in the neck and skull base for dissection. Skull: Negative Sinuses/Orbits: Endoscopic sinus surgery with opacified left ethmoid air cell. IMPRESSION: Acute left MCA distribution infarct at the insula and frontal operculum. Mild involvement of the left caudate head that is newly visible. No hemorrhagic conversion. Electronically Signed   By: Monte Fantasia M.D.   On: 11/09/2018 05:26   Ct Angio Neck W Or Wo Contrast  Result Date: 11/08/2018 CLINICAL DATA:  43 y/o  F; code stroke, aphasia. EXAM: CT ANGIOGRAPHY HEAD AND NECK TECHNIQUE: Multidetector CT imaging of the head and neck was performed using the standard protocol during bolus administration of intravenous contrast. Multiplanar CT image reconstructions and MIPs were obtained to evaluate the vascular anatomy. Carotid stenosis measurements (when applicable) are obtained utilizing NASCET criteria, using the distal internal carotid diameter as the denominator. CONTRAST:  6mL OMNIPAQUE IOHEXOL 350 MG/ML SOLN COMPARISON:  11/08/2018 CT head FINDINGS: CTA NECK FINDINGS Aortic arch: Standard branching. Imaged portion shows no evidence of aneurysm or dissection. No significant stenosis of the major arch vessel origins. Right carotid system: No evidence of dissection, stenosis (50% or greater) or occlusion. Left carotid system: Patent left common carotid artery. 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 Occlusion of the left internal carotid artery from the C2 level to distal cavernous segment. Vertebral arteries: Codominant. No evidence of  dissection, stenosis (50% or greater) or occlusion. Skeleton: Mild reversal of cervical curvature and multilevel discogenic degenerative changes. No acute osseous abnormality. No high-grade bony spinal canal stenosis. Other neck: Negative Upper chest:  Negative. Review of the MIP images confirms the above findings CTA HEAD FINDINGS Anterior circulation: Occlusion of the left ICA from the C2 level to the distal cavernous segment. Patent paraclinoid and terminal left ICA, patent left PCOM, patent left ophthalmic artery. Occlusion of a left M2 anterolateral frontal branch (series 8, image 107). No additional area of significant stenosis, proximal occlusion, aneurysm, or vascular malformation. Posterior circulation: No significant stenosis, proximal occlusion, aneurysm, or vascular malformation. Venous sinuses: As permitted by contrast timing, patent. Anatomic variants: Fetal left PCA. No right posterior communicating artery identified, likely hypoplastic or absent. Patent anterior communicating artery. Review of the MIP images confirms the above findings IMPRESSION: CTA neck: 1. 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 Occlusion of left ICA from C2 to the distal cavernous segment. 2. No additional occlusion, dissection, aneurysm, or hemodynamically significant stenosis by NASCET criteria. CTA head: 1. Occlusion of left ICA from C2 level to the distal cavernous segment. 2. Occlusion of a left M2 anterolateral frontal branch. 3. Otherwise negative CTA of the head and neck. No additional large vessel occlusion, aneurysm, or significant stenosis. These results were called by telephone at the time of interpretation on 11/08/2018 at 7:35 pm to Dr. Archie Balboa, who verbally acknowledged these results. Electronically Signed   By: Kristine Garbe M.D.   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 Previous aspects score of 6. EXAM: CT HEAD WITHOUT CONTRAST CT PERFUSION BRAIN TECHNIQUE: Multidetector CT imaging of the head was performed using the  standard protocol. Multiphase CT imaging of the brain was performed following IV bolus contrast injection. Subsequent parametric perfusion maps were calculated using RAPID software. CONTRAST:  51mL ISOVUE-370 IOPAMIDOL (ISOVUE-370) INJECTION 76% COMPARISON:  Prior CT and CTA from earlier the same day. FINDINGS: CT HEAD FINDINGS Brain: Again seen is subtle loss of gray-white matter differentiation involving the left insula, overlying left frontal operculum, compatible with evolving acute left MCA territory infarct. The caudate and putamen appear relatively preserved on this examination as does the left internal capsule. Changes are somewhat more conspicuous from previous. Overall distribution remains fairly similar in size and morphology. Otherwise, appearance of the brain is stable and normal in appearance. No acute intracranial hemorrhage. No new large vessel territory infarct. No midline shift or mass effect. No hydrocephalus. No extra-axial fluid collection. Vascular: Persistent hyperdensity at the proximal left M2 branch, compatible with previously identified left M2 occlusion (series 6, image 43). Skull: Scalp soft tissues and calvarium demonstrate no acute finding. Sinuses/Orbits: Globes and orbital soft tissues within normal limits. Chronic left ethmoidal sinusitis with sequelae of prior left paranasal sinus surgery. Mastoid air cells are clear. Other: None. ASPECTS [facility] Stroke Program Early CT Score) - Ganglionic level infarction (caudate, lentiform nuclei, internal capsule, insula, M1-M3 cortex): 6 - Supraganglionic infarction (M4-M6 cortex): 2 Total score (0-10 with 10 being normal): 8 Review of the MIP images confirms the above findings CT Brain Perfusion Findings: CBF (<30%) Volume: 27mL Perfusion (Tmax>6.0s) volume: 8mL Mismatch Volume: 39mL Infarction Location:Core infarct involves the left insular and frontal operculum region. The left deep gray nuclei appear relatively spared. IMPRESSION: 1.  Continued interval evolution of acute left MCA territory infarct involving the left insula and overlying left frontal operculum. The left caudate and lentiform nuclei appear relatively preserved on this exam. Aspects calculated as 8, previously 6. 2. Persistent hyperdensity involving proximal left M2 branch, compatible with previously identified left M2 occlusion. 3. Acute core infarct involving the left insula and overlying left frontal operculum. Small surrounding penumbra as above. These results were communicated to Dr. Rory Percy at 10:19 pmon 2/13/2020by text page via the [facility] messaging system. Electronically Signed   By: Jeannine Boga M.D.   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 Previous aspects score of 6. EXAM: CT HEAD WITHOUT CONTRAST CT PERFUSION BRAIN TECHNIQUE: Multidetector CT imaging of the head was performed using the standard protocol. Multiphase CT imaging of the brain was performed following IV bolus contrast injection. Subsequent parametric perfusion maps were calculated using RAPID software. CONTRAST:  43mL ISOVUE-370 IOPAMIDOL (ISOVUE-370) INJECTION 76% COMPARISON:  Prior CT and CTA from earlier the same day. FINDINGS: CT HEAD FINDINGS Brain: Again seen is subtle loss of gray-white matter differentiation involving the left insula, overlying left frontal operculum, compatible with evolving acute left MCA territory infarct. The caudate and putamen appear relatively preserved on this examination as does the left internal capsule. Changes are somewhat more conspicuous from previous. Overall distribution remains fairly similar in size and morphology. Otherwise, appearance of the brain is stable and normal in appearance. No acute intracranial hemorrhage. No new large vessel territory infarct. No midline shift or mass effect. No  hydrocephalus. No extra-axial fluid collection. Vascular: Persistent hyperdensity at the proximal left M2 branch, compatible with previously identified left M2 occlusion (series 6, image 43). Skull: Scalp soft tissues and calvarium demonstrate no acute finding. Sinuses/Orbits: Globes and orbital soft tissues within normal limits. Chronic left ethmoidal sinusitis with sequelae of prior left paranasal sinus surgery. Mastoid air cells are clear. Other: None. ASPECTS [facility] Stroke Program Early CT Score) - Ganglionic level infarction (caudate, lentiform nuclei, internal capsule, insula, M1-M3 cortex): 6 - Supraganglionic infarction (M4-M6 cortex): 2 Total score (0-10 with 10 being normal): 8 Review of the MIP images confirms the above findings CT Brain Perfusion Findings: CBF (<30%) Volume: 30mL Perfusion (Tmax>6.0s) volume: 30mL Mismatch Volume: 95mL Infarction Location:Core infarct involves the left insular and frontal operculum region. The left deep gray nuclei appear relatively spared. IMPRESSION: 1. Continued interval evolution of acute left MCA territory infarct involving the left insula and overlying left frontal operculum. The left caudate and lentiform nuclei appear relatively preserved on this exam. Aspects calculated as 8, previously 6. 2. Persistent hyperdensity involving proximal left M2 branch, compatible with previously identified left M2 occlusion. 3. Acute core infarct involving the left insula and overlying left frontal operculum. Small surrounding penumbra as above. These results were communicated to Dr. Rory Percy at 10:19 pmon 2/13/2020by text page via the [facility] messaging system. Electronically Signed   By: Jeannine Boga M.D.   On: 11/08/2018 22:21   Ct Head Code Stroke Wo Contrast  Result Date: 11/08/2018 CLINICAL DATA:  Code stroke.  Aphasia. EXAM: CT HEAD WITHOUT CONTRAST TECHNIQUE: Contiguous axial images were obtained from the base of the skull through the vertex without intravenous  contrast. COMPARISON:  None. FINDINGS: Brain: There is loss of gray-white differentiation consistent with acute infarction in the left MCA territory involving the insula, caudate head,  lentiform nucleus, and frontal operculum. No intracranial hemorrhage, midline shift, or extra-axial fluid collection is identified. The ventricles and sulci are normal. Vascular: Hyperdense proximal left MCA. Skull: No fracture or focal osseous lesion. Sinuses/Orbits: Partially visualized left ethmoid air cell opacification. Clear mastoid air cells. Unremarkable included orbits. Other: None. ASPECTS [facility] Stroke Program Early CT Score) - Ganglionic level infarction (caudate, lentiform nuclei, internal capsule, insula, M1-M3 cortex): 3 - Supraganglionic infarction (M4-M6 cortex): 3 Total score (0-10 with 10 being normal): 6 IMPRESSION: 1. Acute nonhemorrhagic left MCA infarct with hyperdense proximal MCA. 2. ASPECTS is 6. These results were called by telephone at the time of interpretation on 11/08/2018 at 5:00 pm to Dr. Archie Balboa, who verbally acknowledged these results. Electronically Signed   By: Logan Bores M.D.   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 Estanislado Pandy. Patient's condition improving- can spontaneously move all extremities, speech is clear. Right  groin incision stable with sheath intact- plan for sheath removal today. Continue taking Brilinta 90 mg twice daily and Aspirin 81 mg once daily- will obtain P2Y12 today to check effectiveness of DAPT. Plan to follow-up with Dr. Estanislado Pandy in clinic 4 weeks after discharge. Appreciate and agree with neurology management. IR to follow.   Electronically Signed: Earley Abide, PA-C 11/09/2018, 9:02 AM   I spent a total of 25 Minutes at the the patient's bedside AND on the patient's hospital floor or unit, greater than 50% of which was counseling/coordinating care for left MCA M1 segment occlusion s/p revascularization.

## 2018-11-09 NOTE — Progress Notes (Signed)
Right femoral sheath transduced upon arrival to PACU.  Pulsatile blood flow noted on monitor during patient's time in PACU.
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
## 2018-11-09 NOTE — Progress Notes (Signed)
Interim progress note  Remains mildly hypotensive. Bolused NS 500cc x2. SBP 110-120s. Received phenergan for nausea and was drowsy for a few hours after IR. At this time AAOx3, speech not dysarthric, some paraphasic errors, wrong age when asked, correct month. No CN deficits. No motor or sensory deficits. Sheath in place s/p LICA stenting and LMCA thrombectomy with TICI2b revascularization. Repeat CTH due to drowsiness with expected left frontal inraction and mild left caudate involvement which is new from prior scan. No bleed.  Continue current management. Stroke team will round in AM  -- Amie Portland, MD [facility] Pager: [phone] If 7pm to 7am, please call on call as listed on AMION.

## 2018-11-09 NOTE — Progress Notes (Signed)
Patient ID: Vanessa Sharp, female   DOB: April 10, 1976, 43 y.o.   MRN: [id number] INR. Post procedure. Extubated. Maintaining O2sats. Moving all 4s equally. Pupils 72mm Rt = LT. RT groin sheath in situ because of spasm. Distal pulses palpable DPs and PTs. S.Dawanda Mapel MD

## 2018-11-09 NOTE — Progress Notes (Signed)
PT Cancellation Note  Patient Details Name: Vanessa Sharp MRN: [id number] DOB: May 30, 1976   Cancelled Treatment:    Reason Eval/Treat Not Completed: Medical issues which prohibited therapy;Active bedrest order(nurse states to check back tomorrow )  Zachary George, [place] [phone]  Clare Casto 11/09/2018, 9:12 AM

## 2018-11-09 NOTE — Anesthesia Postprocedure Evaluation (Signed)
Anesthesia Post Note  Patient: Vanessa Sharp  Procedure(s) Performed: IR WITH ANESTHESIA (N/A )     Patient location during evaluation: PACU Anesthesia Type: General Level of consciousness: awake Pain management: pain level controlled Vital Signs Assessment: post-procedure vital signs reviewed and stable Respiratory status: spontaneous breathing Cardiovascular status: stable Postop Assessment: no apparent nausea or vomiting Anesthetic complications: no    Last Vitals:  Vitals:   11/09/18 0156 11/09/18 0200  BP: 116/65   Pulse: 93 90  Resp: 18 18  Temp:    SpO2: 100% 100%    Last Pain: There were no vitals filed for this visit.               Euel Castile

## 2018-11-09 NOTE — Code Documentation (Signed)
Rapid Response Event Note - CODE STROKE IR ACTIVATION.   Responded to Code Stroke that was activated for IR. I met the patient and [facility] in CT2, patient was unable to get CTP at [facility] (staff RNs unable to access necessary 18G PIV). CT Tech at [facility] was able establish PIV access, CTP was done and patient was taken straight to IR. LSN 1500, IV TPA was given at [facility] of 4 on arrival at [facility]   Tonette Koehne R

## 2018-11-09 NOTE — Progress Notes (Signed)
OT Cancellation Note  Patient Details Name: Vanessa Sharp MRN: [id number] DOB: December 17, 1975   Cancelled Treatment:    Reason Eval/Treat Not Completed: Medical issues which prohibited therapy;Active bedrest order (nurse states to check back tomorrow )  [place], OTR/L Acute Rehab Services Pager [phone] Office [phone]     Almon Register 11/09/2018, 9:43 AM

## 2018-11-09 NOTE — Progress Notes (Signed)
Neuro IR Addendum to previous radiology note. Rt femoral site began to slightly ooze after 30 min hold time.  Manual pressure applied for 15 additional minutes to achieve hemostasis at 1004  Site reviewed with Sheran Luz.  Pressure dressing applied. No acute complications or hematoma. Distal pulses intact.   [facility]

## 2018-11-09 NOTE — Procedures (Signed)
S/P  Bilateral common carotid arteriograms followed by complete revascularization of occluded Lt MCA M 1 seg with x 1 pass with 31mm x 40 mm solitaire x retriver device achieving a TICI2b revascularization. 2. S/P placement of telescoping stent/flow diverters to treat acute long segsymptomatic dissection .

## 2018-11-09 NOTE — Progress Notes (Signed)
Patient ID: Vanessa Sharp, female   DOB: [birth year], 43 y.o.   MRN: [id number] INR. 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 CT Brain No ICH. ASPECTS 7 NO ICH. CTA occluded Lt MCA ?M2 segand occluded Lt ICA sec to  A long seg dissection . IVTPA given CTP maps core of 48ml  Versus Tmax > 6s vol of 36. OPtion of endovascular revascularization D/W husband.Procedure,reasons,alternatives reviewed.Risks of ICHof 10 %,worsening neuro deficit,vent dependency death ,inability to revascularize  and vascular injury reviewed. Husband expressed understanding and agreed to provide consent to tproceed with endovascular revascularization.Marland Kitchen S.Esbeydi Manago MD

## 2018-11-09 NOTE — Evaluation (Signed)
Speech Language Pathology Evaluation Patient Details Name: Vanessa Sharp MRN: [id number] DOB: [birth year] Today's Date: 11/09/2018 Time: 8546-2703 SLP Time Calculation (min) (ACUTE ONLY): 16 min  Problem List:  Patient Active Problem List   Diagnosis Date Noted  . 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 Acute ischemic stroke ([place]) 11/08/2018   Past Medical History: History reviewed. No pertinent past medical history. Past Surgical History:  Past Surgical History:  Procedure Laterality Date  . ABDOMINAL HYSTERECTOMY  09/21/2017   cervical cancer  . RADIOLOGY WITH ANESTHESIA N/A 11/08/2018   Procedure: IR WITH ANESTHESIA;  Surgeon: Luanne Bras, MD;  Location: [place];  Service: Radiology;  Laterality: N/A;   HPI:  Pt is a 43 y.o. female who presented to [facility] for sudden onset of word finding difficulty and confusion. She was evaluated by telemedicine neurology. IV TPA was administered on 11/08/18. CT of the head of 11/09/18 revealed acute left MCA distribution infarct at the insula and frontal operculum with mild involvement of the left caudate head.    Assessment / Plan / Recommendation Clinical Impression  Pt participated in speech/language evaluation. She was alert and cooperative throughout the evaluation and her husband was present throughout it. Pt presents with moderate non-fluent aphasia characterized by impairments in both receptive and expressive language with more notable deficits in verbal expression. She was able to follow 2-step commands and answer simple and complex yes/no questions. However, she demonstrated difficulty with reading comprehension and auditory comprehension at the paragraph level. Expressively, she was able to produce automatic sequences and complete verbally-presented sentences. However, she exhibited difficulty with word retrieval and paraphasias as well as perseveration were noted during attempts at word retrieval. Skilled  SLP services are clinically indicated at this time for aphasia intervention.     SLP Assessment  SLP Recommendation/Assessment: Patient needs continued Speech Lanaguage Pathology Services SLP Visit Diagnosis: Aphasia (R47.01)    Follow Up Recommendations  Inpatient Rehab    Frequency and Duration min 2x/week  2 weeks      SLP Evaluation Cognition  Overall Cognitive Status: Difficult to assess(Due to aphasia ) Orientation Level: Oriented to person;Disoriented to place;Disoriented to time;Disoriented to situation Attention: Focused;Sustained Focused Attention: Appears intact Sustained Attention: Appears intact Awareness: Appears intact       Comprehension  Auditory Comprehension Overall Auditory Comprehension: Impaired Yes/No Questions: Impaired Other Yes/No Questions Comments`: (Simple: 5/5; Complex: 4/5) Commands: Impaired Two Step Basic Commands: (3/4) Multistep Basic Commands: (2/4) Conversation: Simple EffectiveTechniques: Extra processing time;Repetition;Slowed speech Reading Comprehension Reading Status: Impaired Word level: Within functional limits Sentence Level: Within functional limits Paragraph Level: Impaired(4/6) Functional Environmental (signs, name badge): Not tested    Expression Expression Primary Mode of Expression: Verbal Verbal Expression Overall Verbal Expression: Impaired Initiation: Impaired Automatic Speech: Counting;Day of week;Month of year(WFL) Level of Generative/Spontaneous Verbalization: Phrase Repetition: No impairment Naming: Impairment Responsive: (2/5) Confrontation: (3/10) Convergent: (Sentence completion: 5/5) Verbal Errors: Semantic paraphasias;Phonemic paraphasias;Neologisms;Aware of errors;Perseveration Pragmatics: No impairment   Oral / Motor  Motor Speech Overall Motor Speech: Appears within functional limits for tasks assessed Respiration: Within functional limits Phonation: Normal Resonance: Within functional  limits Articulation: Within functional limitis Intelligibility: Intelligible Motor Planning: Witnin functional limits   Vanessa Sharp I. Hardin Negus, [place], [place] Office number [phone] Pager [place] 11/09/2018, 3:31 PM

## 2018-11-09 NOTE — Progress Notes (Signed)
Upon arrival to the unit, pt was sitting up at a 30 degree angle, I informed the nurses that the pt still had a sheath in place and had to remain flat. In report I was informed groin site was a level 0, upon handoff assessment the pt had a small hematoma (lvl 1) above groin site. This was marked and will continue to be monitored.    pts pressure per Sheath are running < 120 SBP, orders from Dr. Rory Percy for a 500cc bolus, if pressure remains <120 then another 500 CC bolus will be ordered.

## 2018-11-09 NOTE — Transfer of Care (Signed)
Immediate Anesthesia Transfer of Care Note  Patient: Vanessa Sharp  Procedure(s) Performed: IR WITH ANESTHESIA (N/A )  Patient Location: PACU  Anesthesia Type:General  Level of Consciousness: sedated  Airway & Oxygen Therapy: Patient connected to face mask oxygen  Post-op Assessment: Report given to RN and Post -op Vital signs reviewed and stable  Post vital signs: Reviewed and stable  Last Vitals:  Vitals Value Taken Time  BP 107/69 11/09/2018 12:56 AM  Temp    Pulse 83 11/09/2018 12:58 AM  Resp 18 11/09/2018 12:58 AM  SpO2 100 % 11/09/2018 12:58 AM  Vitals shown include unvalidated device data.  Last Pain: There were no vitals filed for this visit.       Complications: No apparent anesthesia complications

## 2018-11-09 NOTE — Sedation Documentation (Signed)
Pt moving all extremities post extubation. Will not open eyes or answer any questions or follow commands.

## 2018-11-09 NOTE — Progress Notes (Signed)
11/09/2018 Interventional Radiology here at bedside to remove 8Fr right groin sheath.  Prior to removal small marked hematoma noted.  Sheath removed at 0923, pressure held for 30 mins until hemostasis achieved.  V-Pad was used, site dressed with gauze and tegaderm.  Pressure dressing applied.  Vanessa Sharp educated regarding post sheath removal safety.  Raquel Sarna RN at bedside after removal to review site condition.  Distal pulses right DP doppler, PT doppler.  jkc/hhf

## 2018-11-09 NOTE — Progress Notes (Signed)
Patient's right groin sheath was transduced while pt was in PACU.

## 2018-11-09 NOTE — Progress Notes (Signed)
  Echocardiogram 2D Echocardiogram has been performed.  Vanessa Sharp M 11/09/2018, 2:24 PM

## 2018-11-10 DIAGNOSIS — I6602 Occlusion and stenosis of left middle cerebral artery: Secondary | ICD-10-CM

## 2018-11-10 DIAGNOSIS — R4701 Aphasia: Secondary | ICD-10-CM

## 2018-11-10 LAB — CBC
HCT: 34.6 % — ABNORMAL LOW (ref 36.0–46.0)
Hemoglobin: 11.1 g/dL — ABNORMAL LOW (ref 12.0–15.0)
MCH: 29.1 pg (ref 26.0–34.0)
MCHC: 32.1 g/dL (ref 30.0–36.0)
MCV: 90.8 fL (ref 80.0–100.0)
Platelets: 160 10*3/uL (ref 150–400)
RBC: 3.81 MIL/uL — ABNORMAL LOW (ref 3.87–5.11)
RDW: 13.2 % (ref 11.5–15.5)
WBC: 10.1 10*3/uL (ref 4.0–10.5)
nRBC: 0 % (ref 0.0–0.2)

## 2018-11-10 LAB — BASIC METABOLIC PANEL
Anion gap: 5 (ref 5–15)
BUN: 8 mg/dL (ref 6–20)
CO2: 22 mmol/L (ref 22–32)
Calcium: 7.7 mg/dL — ABNORMAL LOW (ref 8.9–10.3)
Chloride: 113 mmol/L — ABNORMAL HIGH (ref 98–111)
Creatinine, Ser: 0.5 mg/dL (ref 0.44–1.00)
GFR calc Af Amer: >60 mL/min (ref >60)
GFR calc non Af Amer: >60 mL/min (ref >60)
GLUCOSE: 90 mg/dL (ref 70–99)
Potassium: 3.4 mmol/L — ABNORMAL LOW (ref 3.5–5.1)
Sodium: 140 mmol/L (ref 135–145)

## 2018-11-10 LAB — SAMPLE TO BLOOD BANK

## 2018-11-10 MED ORDER — ADULT MULTIVITAMIN W/MINERALS CH
1.0000 | ORAL_TABLET | Freq: Every day | ORAL | Status: DC
  Administered 2018-11-10 – 2018-11-11 (×2): 1 via ORAL
  Filled 2018-11-10 (×2): qty 1

## 2018-11-10 MED ORDER — MELATONIN 3 MG PO TABS
9.0000 mg | ORAL_TABLET | Freq: Every day | ORAL | Status: DC
  Administered 2018-11-10: 9 mg via ORAL
  Filled 2018-11-10 (×2): qty 3

## 2018-11-10 MED ORDER — FERROUS SULFATE 325 (65 FE) MG PO TABS
325.0000 mg | ORAL_TABLET | Freq: Every day | ORAL | Status: DC
  Administered 2018-11-11: 325 mg via ORAL
  Filled 2018-11-10: qty 1

## 2018-11-10 MED ORDER — PANTOPRAZOLE SODIUM 40 MG PO TBEC
40.0000 mg | DELAYED_RELEASE_TABLET | Freq: Every day | ORAL | Status: DC
  Administered 2018-11-10 – 2018-11-11 (×2): 40 mg via ORAL
  Filled 2018-11-10 (×2): qty 1

## 2018-11-10 MED ORDER — POTASSIUM CHLORIDE CRYS ER 20 MEQ PO TBCR
20.0000 meq | EXTENDED_RELEASE_TABLET | Freq: Two times a day (BID) | ORAL | Status: DC
  Administered 2018-11-10 – 2018-11-11 (×3): 20 meq via ORAL
  Filled 2018-11-10 (×3): qty 1

## 2018-11-10 MED ORDER — POTASSIUM CHLORIDE CRYS ER 20 MEQ PO TBCR: 40.0000 meq | EXTENDED_RELEASE_TABLET | Freq: Two times a day (BID) | ORAL | Status: DC

## 2018-11-10 MED ORDER — VITAMIN D 25 MCG (1000 UNIT) PO TABS
1000.0000 [IU] | ORAL_TABLET | Freq: Every day | ORAL | Status: DC
  Administered 2018-11-11: 1000 [IU] via ORAL
  Filled 2018-11-10: qty 1

## 2018-11-10 NOTE — Consult Note (Addendum)
Physical Medicine and Rehabilitation Consult Reason for Consult:right sided weakness and language deficits Referring Physician: Leonie Man   HPI: Vanessa Sharp is a 43 y.o. female with a history of recent hysterectomy who presented on 11/08/2018 to [facility] with word finding deficits and confusion. She was administered TPA and following CTA of head and neck revealed left carotid dissection and left M2 occlusion. Pt seen by INR and found to have left M1 occlusion with subsequent mechanical thrombectomy and stenting of left M1 segment on 11/08/2018. Post- procedure pt reporting improved aphasia. MRI reveals infarct involving left frontal operculum and insula. Pt ambulated with therapy on 2/15 and demonstrated decreased balance, shuffling gait. PM&R asked to assess pt for potential rehab needs.    Review of Systems  Constitutional: Negative for fever.  HENT: Negative for hearing loss.   Eyes: Negative for blurred vision.  Respiratory: Negative for cough.   Cardiovascular: Negative for chest pain.  Gastrointestinal: Negative for nausea.  Genitourinary: Negative for dysuria.  Musculoskeletal: Negative for myalgias.  Skin: Negative for rash.  Neurological: Positive for speech change, focal weakness and headaches.  Psychiatric/Behavioral: Negative for suicidal ideas.   History reviewed. No pertinent past medical history. Past Surgical History:  Procedure Laterality Date  . ABDOMINAL HYSTERECTOMY  09/21/2017   cervical cancer  . RADIOLOGY WITH ANESTHESIA N/A 11/08/2018   Procedure: IR WITH ANESTHESIA;  Surgeon: Luanne Bras, MD;  Location: [facility];  Service: Radiology;  Laterality: N/A;   Family History  Problem Relation Age of Onset  . Breast cancer Mother    Social History:  has no history on file for tobacco, alcohol, and drug. Allergies:  Allergies  Allergen Reactions  . Hydrocodone Nausea Only and Nausea And Vomiting  . Pseudoephedrine Hcl Other (See Comments)    Pass  out   Medications Prior to Admission  Medication Sig Dispense Refill  . Cholecalciferol (VITAMIN D-1000 MAX ST) 25 MCG (1000 UT) tablet Take 1,000 mg by mouth daily.    . ferrous sulfate 325 (65 FE) MG tablet Take 325 mg by mouth daily.    . Melatonin 10 MG CAPS Take 10 mg by mouth Nightly.    . Multiple Vitamin (MULTI-VITAMIN DAILY) TABS Take 1 tablet by mouth daily.    . rizatriptan (MAXALT) 10 MG tablet Take 10 mg by mouth once as needed for migraine.       Home: Home Living Family/patient expects to be discharged to:: Private residence Living Arrangements: Spouse/significant other, Parent Available Help at Discharge: Family, Available 24 hours/day Type of Home: House Home Access: Stairs to enter [employer] of Steps: 5-6 around back Entrance Stairs-Rails: Right Home Layout: Two level, Able to live on main level with bedroom/bathroom Bathroom Shower/Tub: Multimedia programmer: Standard Home Equipment: None  Lives With: Spouse  Functional History: Prior Function Level of Independence: Independent Comments: Works in Press photographer at [employer]:  Mobility: [place] bed mobility: Needs Assistance Bed Mobility: Supine to Sit Supine to sit: Supervision General bed mobility comments: Pt was able to transition to EOB without assistance. Increased time and effort required to get feet on floor. VC's throughout to complete transfer and get feet on floor.  Transfers Overall transfer level: Needs assistance Equipment used: None Transfers: Sit to/from Stand Sit to Stand: Min assist General transfer comment: Assist for balance support as pt powered up to full stand. Increased time once standing to gain/maintain standing balance.  Ambulation/Gait Ambulation/Gait assistance: Min assist Gait Distance (Feet): 150  Feet Assistive device: 1 person hand held assist Gait Pattern/deviations: Decreased stride length, Trunk flexed, Shuffle, Narrow base of  support, Step-through pattern General Gait Details: Shuffling gait pattern initially progressing to step-through with decreased floor clearance. Pt ambulating slow and unsteady with min assist for balance support.  Gait velocity: Decreased Gait velocity interpretation: 1.31 - 2.62 ft/sec, indicative of limited community ambulator    ADL:    Cognition: Cognition Overall Cognitive Status: Difficult to assess Orientation Level: Oriented X4 Attention: Focused, Sustained Focused Attention: Appears intact Sustained Attention: Appears intact Awareness: Appears intact Cognition Arousal/Alertness: Awake/alert Behavior During Therapy: WFL for tasks assessed/performed Overall Cognitive Status: Difficult to assess Difficult to assess due to: Impaired communication(aphasic)  Blood pressure 125/73, pulse 88, temperature 98.4 F (36.9 C), temperature source Oral, resp. rate 18, last menstrual period 05/15/2017, SpO2 100 %. Physical Exam  Constitutional: She is oriented to person, place, and time. She appears well-developed and well-nourished.  HENT:  Head: Normocephalic and atraumatic.  Respiratory: Effort normal.  GI: Soft.  Musculoskeletal: Normal range of motion.  Neurological: She is alert and oriented to person, place, and time. No cranial nerve deficit.  Pt demonstrates normal insight and awareness. Able to review biographical information and answer simple questions. Some delays with espression of more complex language. Minimal right pronator drift. Motor 4+/5 RUe and RLE. 5/5 LUE and LLE. No focal sensory findings. DTR's 1+  Psychiatric: She has a normal mood and affect. Her behavior is normal.    Results for orders placed or performed during the hospital encounter of 11/08/18 (from the past 24 hour(s))  CBC     Status: Abnormal   Collection Time: 11/10/18  6:09 AM  Result Value Ref Range   WBC 10.1 4.0 - 10.5 K/uL   RBC 3.81 (L) 3.87 - 5.11 MIL/uL   Hemoglobin 11.1 (L) 12.0 - 15.0  g/dL   HCT 34.6 (L) 36.0 - 46.0 %   MCV 90.8 80.0 - 100.0 fL   MCH 29.1 26.0 - 34.0 pg   MCHC 32.1 30.0 - 36.0 g/dL   RDW 13.2 11.5 - 15.5 %   Platelets 160 150 - 400 K/uL   nRBC 0.0 0.0 - 0.2 %  Basic metabolic panel     Status: Abnormal   Collection Time: 11/10/18  6:09 AM  Result Value Ref Range   Sodium 140 135 - 145 mmol/L   Potassium 3.4 (L) 3.5 - 5.1 mmol/L   Chloride 113 (H) 98 - 111 mmol/L   CO2 22 22 - 32 mmol/L   Glucose, Bld 90 70 - 99 mg/dL   BUN 8 6 - 20 mg/dL   Creatinine, Ser 0.50 0.44 - 1.00 mg/dL   Calcium 7.7 (L) 8.9 - 10.3 mg/dL   GFR calc non Af Amer >60 >60 mL/min   GFR calc Af Amer >60 >60 mL/min   Anion gap 5 5 - 15   Ct Head Wo Contrast  Result Date: 11/09/2018 CLINICAL DATA:  Altered level of consciousness. EXAM: CT HEAD WITHOUT CONTRAST TECHNIQUE: Contiguous axial images were obtained from the base of the skull through the vertex without intravenous contrast. COMPARISON:  Head CT from yesterday FINDINGS: Brain: Cytotoxic edema in the left insula, frontal operculum, and now seen at the caudate head. No hemorrhagic complication. No significant mass effect. Vascular: No hyperdense vessel. Left ICA stent in the neck and skull base for dissection. Skull: Negative Sinuses/Orbits: Endoscopic sinus surgery with opacified left ethmoid air cell. IMPRESSION: Acute left MCA distribution infarct  at the insula and frontal operculum. Mild involvement of the left caudate head that is newly visible. No hemorrhagic conversion. Electronically Signed   By: Monte Fantasia M.D.   On: 11/09/2018 05:26   Mr Brain Wo Contrast  Result Date: 11/09/2018 CLINICAL DATA:  Stroke follow-up.  Status post stent EXAM: MRI HEAD WITHOUT CONTRAST TECHNIQUE: Multiplanar, multiecho pulse sequences of the brain and surrounding structures were obtained without intravenous contrast. COMPARISON:  Head CT 11/09/2018 FINDINGS: BRAIN: There is abnormal diffusion restriction within the left MCA territory,  predominantly involving the left frontal operculum and anterior insula, as well as the basal ganglia. The midline structures are normal. No midline shift or other mass effect. There is mild edema at the infarct site but the white matter is otherwise normal. The cerebral and cerebellar volume are age-appropriate. Susceptibility-sensitive sequences show no chronic microhemorrhage or superficial siderosis. VASCULAR: Major intracranial arterial and venous sinus flow voids are normal. SKULL AND UPPER CERVICAL SPINE: Calvarial bone marrow signal is normal. There is no skull base mass. Visualized upper cervical spine and soft tissues are normal. SINUSES/ORBITS: No fluid levels or advanced mucosal thickening. No mastoid or middle ear effusion. The orbits are normal. IMPRESSION: Medium-sized infarct within the left MCA territory, predominantly involving the frontal operculum and insula. No midline shift or acute hemorrhage. Left MCA flow void is normal. Electronically Signed   By: Ulyses Jarred M.D.   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 Previous aspects score of 6. EXAM: CT HEAD WITHOUT CONTRAST CT PERFUSION BRAIN TECHNIQUE: Multidetector CT imaging of the head was performed using the standard protocol. Multiphase CT imaging of the brain was performed following IV bolus contrast injection. Subsequent parametric perfusion maps were calculated using RAPID software. CONTRAST:  57mL ISOVUE-370 IOPAMIDOL (ISOVUE-370) INJECTION 76% COMPARISON:  Prior CT and CTA from earlier the same day. FINDINGS: CT HEAD FINDINGS Brain: Again seen is subtle loss of gray-white matter differentiation involving the left insula, overlying left frontal operculum, compatible with evolving acute left MCA territory infarct. The caudate and putamen appear relatively preserved on this  examination as does the left internal capsule. Changes are somewhat more conspicuous from previous. Overall distribution remains fairly similar in size and morphology. Otherwise, appearance of the brain is stable and normal in appearance. No acute intracranial hemorrhage. No new large vessel territory infarct. No midline shift or mass effect. No hydrocephalus. No extra-axial fluid collection. Vascular: Persistent hyperdensity at the proximal left M2 branch, compatible with previously identified left M2 occlusion (series 6, image 43). Skull: Scalp soft tissues and calvarium demonstrate no acute finding. Sinuses/Orbits: Globes and orbital soft tissues within normal limits. Chronic left ethmoidal sinusitis with sequelae of prior left paranasal sinus surgery. Mastoid air cells are clear. Other: None. ASPECTS [facility] Stroke Program Early CT Score) - Ganglionic level infarction (caudate, lentiform nuclei, internal capsule, insula, M1-M3 cortex): 6 - Supraganglionic infarction (M4-M6 cortex): 2 Total score (0-10 with 10 being normal): 8 Review of the MIP images confirms the above findings CT Brain Perfusion Findings: CBF (<30%) Volume: 63mL Perfusion (Tmax>6.0s) volume: 2mL Mismatch Volume: 65mL Infarction Location:Core infarct involves the left insular and frontal operculum region. The left deep gray nuclei appear relatively spared. IMPRESSION: 1. Continued interval evolution of acute left MCA territory infarct involving the left insula and overlying left frontal operculum. The left caudate and lentiform nuclei appear  relatively preserved on this exam. Aspects calculated as 8, previously 6. 2. Persistent hyperdensity involving proximal left M2 branch, compatible with previously identified left M2 occlusion. 3. Acute core infarct involving the left insula and overlying left frontal operculum. Small surrounding penumbra as above. These results were communicated to Dr. Rory Percy at 10:19 pmon 2/13/2020by text page via the  [facility] messaging system. Electronically Signed   By: Jeannine Boga M.D.   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 Previous aspects score of 6. EXAM: CT HEAD WITHOUT CONTRAST CT PERFUSION BRAIN TECHNIQUE: Multidetector CT imaging of the head was performed using the standard protocol. Multiphase CT imaging of the brain was performed following IV bolus contrast injection. Subsequent parametric perfusion maps were calculated using RAPID software. CONTRAST:  55mL ISOVUE-370 IOPAMIDOL (ISOVUE-370) INJECTION 76% COMPARISON:  Prior CT and CTA from earlier the same day. FINDINGS: CT HEAD FINDINGS Brain: Again seen is subtle loss of gray-white matter differentiation involving the left insula, overlying left frontal operculum, compatible with evolving acute left MCA territory infarct. The caudate and putamen appear relatively preserved on this examination as does the left internal capsule. Changes are somewhat more conspicuous from previous. Overall distribution remains fairly similar in size and morphology. Otherwise, appearance of the brain is stable and normal in appearance. No acute intracranial hemorrhage. No new large vessel territory infarct. No midline shift or mass effect. No hydrocephalus. No extra-axial fluid collection. Vascular: Persistent hyperdensity at the proximal left M2 branch, compatible with previously identified left M2 occlusion (series 6, image 43). Skull: Scalp soft tissues and calvarium demonstrate no acute finding. Sinuses/Orbits: Globes and orbital soft tissues within normal limits. Chronic left ethmoidal sinusitis with sequelae of prior left paranasal sinus surgery. Mastoid air cells are clear. Other: None. ASPECTS [facility] Stroke Program Early CT Score) - Ganglionic level infarction (caudate, lentiform nuclei, internal capsule,  insula, M1-M3 cortex): 6 - Supraganglionic infarction (M4-M6 cortex): 2 Total score (0-10 with 10 being normal): 8 Review of the MIP images confirms the above findings CT Brain Perfusion Findings: CBF (<30%) Volume: 20mL Perfusion (Tmax>6.0s) volume: 26mL Mismatch Volume: 26mL Infarction Location:Core infarct involves the left insular and frontal operculum region. The left deep gray nuclei appear relatively spared. IMPRESSION: 1. Continued interval evolution of acute left MCA territory infarct involving the left insula and overlying left frontal operculum. The left caudate and lentiform nuclei appear relatively preserved on this exam. Aspects calculated as 8, previously 6. 2. Persistent hyperdensity involving proximal left M2 branch, compatible with previously identified left M2 occlusion. 3. Acute core infarct involving the left insula and overlying left frontal operculum. Small surrounding penumbra as above. These results were communicated to Dr. Rory Percy at 10:19 pmon 2/13/2020by text page via the [facility] messaging system. Electronically Signed   By: Jeannine Boga M.D.   On: 11/08/2018 22:21    Assessment/Plan: Diagnosis: left MCA infarct d/t Carotid dissection. Pt making nice neurological gains! 1. Does the need for close, 24 hr/day medical supervision in concert with the patient's rehab needs make it unreasonable for this patient to be served in a less intensive setting? No 2. Co-Morbidities requiring supervision/potential complications:   3. Due to bowel management, skin/wound care and pain management, does the patient require 24 hr/day rehab nursing? No 4. Does the patient require coordinated care of a physician, rehab nurse, n/aa to address physical and functional  deficits in the context of the above medical diagnosis(es)? No Addressing deficits in the following areas: strength, dressing and feeding 5. Can the patient actively participate in an intensive therapy program of at least 3 hrs of  therapy per day at least 5 days per week? No 6. The potential for patient to make measurable gains while on inpatient rehab is n/a 7. Anticipated functional outcomes upon discharge from inpatient rehab are n/a  with PT, n/a with OT, n/a with SLP. 8. Estimated rehab length of stay to reach the above functional goals is: n/a 9. Anticipated D/C setting: Home 10. Anticipated post D/C treatments: Outpatient therapy 11. Overall Rehab/Functional Prognosis: excellent  RECOMMENDATIONS: This patient's condition is appropriate for continued rehabilitative care in the following setting: Outpatient Therapy Patient has agreed to participate in recommended program. Yes Note that insurance prior authorization may be required for reimbursement for recommended care.  Comment: Pt is making swift progress. I spoke with her and family. All are in agreement that outpt therapies at [facility] are most appropriate for her. I have placed orders for outpt PT, OT, and SLP today.   Thanks   Meredith Staggers, MD 11/10/2018
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
## 2018-11-10 NOTE — Evaluation (Addendum)
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 PMH significant for cervical CA s/p abdominal hysterectomy.  Clinical Impression  Pt admitted with above diagnosis. Pt currently with functional limitations due to the deficits listed below (see PT Problem List). At the time of PT eval pt was able to perform transfers and ambulation with gross min assist for balance support and safety without an AD. 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 Feel this pt would benefit from continued therapies at the CIR level to maximize functional independence and safety prior to return home with supportive family. I have discussed the patient's current level of function related to mobility with the patient and mother.  They acknowledge understanding of this and do not feel the patient would be able to have their care needs met at home. They are interested in post-acute rehab in an inpatient setting. Acutely, pt will benefit from skilled PT to improve mobility to allow discharge to the venue listed below.       Follow Up Recommendations CIR;Supervision/Assistance - 24 hour    Equipment Recommendations  Other (comment)(TBD by next venue of care)    Recommendations for Other Services Rehab consult     Precautions / Restrictions Precautions Precautions: Fall Restrictions Weight Bearing Restrictions: No      Mobility  Bed Mobility Overal bed mobility: Needs Assistance Bed Mobility: Supine to Sit     Supine to sit: Supervision     General bed mobility comments: Pt was able to transition to EOB without assistance. Increased time and effort required to get feet on floor. VC's throughout to complete transfer and get feet on floor.    Transfers Overall transfer level: Needs assistance Equipment used: None Transfers: Sit to/from Stand Sit to Stand: Min assist         General transfer comment: Assist for balance support as pt powered up to full stand. Increased time once standing to gain/maintain standing balance.   Ambulation/Gait Ambulation/Gait assistance: Min assist Gait Distance (Feet): 150 Feet Assistive device: 1 person hand held assist Gait Pattern/deviations: Decreased stride length;Trunk flexed;Shuffle;Narrow base of support;Step-through pattern Gait velocity: Decreased Gait velocity interpretation: 1.31 - 2.62 ft/sec, indicative of limited community ambulator General Gait Details: Shuffling gait pattern initially progressing to step-through with decreased floor clearance. Pt ambulating slow and unsteady with min assist for balance support.   Stairs            Wheelchair Mobility    Modified Rankin (Stroke Patients Only) Modified Rankin (Stroke Patients Only) Pre-Morbid Rankin Score: No symptoms Modified Rankin: Moderately severe disability     Balance Overall balance assessment: Needs assistance Sitting-balance support: Feet supported;No upper extremity supported Sitting balance-Leahy Scale: Fair     Standing balance support: No upper extremity supported;During functional activity Standing balance-Leahy Scale: Poor Standing balance comment: Requires assist to maintain balance                             Pertinent Vitals/Pain Pain Assessment: Faces Faces Pain Scale: Hurts a little bit Pain Location: Back Pain Descriptors / Indicators: Aching Pain Intervention(s): Limited activity within patient's tolerance;Monitored during session;Repositioned    Home Living Family/patient expects to be discharged to:: Private residence Living  Arrangements: Spouse/significant other;Parent Available Help at Discharge: Family;Available 24 hours/day Type of Home: House Home Access:  Stairs to enter Entrance Stairs-Rails: Right Entrance Stairs-Number of Steps: 5-6 around back Home Layout: Two level;Able to live on main level with bedroom/bathroom Home Equipment: None      Prior Function Level of Independence: Independent         Comments: Works in Press photographer at [employer]        Extremity/Trunk Assessment   Upper Extremity Assessment Upper Extremity Assessment: Defer to OT evaluation    Lower Extremity Assessment Lower Extremity Assessment: Generalized weakness(Decreased muscular endurance on eval)    Cervical / Trunk Assessment Cervical / Trunk Assessment: Normal  Communication   Communication: Expressive difficulties  Cognition Arousal/Alertness: Awake/alert Behavior During Therapy: WFL for tasks assessed/performed Overall Cognitive Status: Difficult to assess                                        General Comments      Exercises     Assessment/Plan    PT Assessment Patient needs continued PT services  PT Problem List Decreased strength;Decreased activity tolerance;Decreased balance;Decreased mobility;Decreased knowledge of use of DME;Decreased safety awareness;Decreased knowledge of precautions;Pain       PT Treatment Interventions DME instruction;Gait training;Stair training;Functional mobility training;Therapeutic activities;Therapeutic exercise;Neuromuscular re-education;Patient/family education    PT Goals (Current goals can be found in the Care Plan section)  Acute Rehab PT Goals Patient Stated Goal: Get home to her boys (ages 68 and 73) PT Goal Formulation: With patient/family Time For Goal Achievement: 11/24/18 Potential to Achieve Goals: Good    Frequency Min 4X/week   Barriers to discharge        Co-evaluation               AM-PAC PT "6 Clicks" Mobility  Outcome Measure Help needed turning from your back to your side while in a flat bed without using bedrails?: None Help needed  moving from lying on your back to sitting on the side of a flat bed without using bedrails?: None Help needed moving to and from a bed to a chair (including a wheelchair)?: A Little Help needed standing up from a chair using your arms (e.g., wheelchair or bedside chair)?: A Little Help needed to walk in hospital room?: A Little Help needed climbing 3-5 steps with a railing? : A Little 6 Click Score: 20    End of Session Equipment Utilized During Treatment: Gait belt Activity Tolerance: Patient limited by fatigue Patient left: in chair;with call bell/phone within reach;with family/visitor present Nurse Communication: Mobility status PT Visit Diagnosis: Unsteadiness on feet (R26.81);Pain;Other symptoms and signs involving the nervous system (R29.898) Pain - part of body: (back)    Time: 1339-1410 PT Time Calculation (min) (ACUTE ONLY): 31 min   Charges:   PT Evaluation $PT Eval Moderate Complexity: 1 Mod PT Treatments $Gait Training: 8-22 mins        Rolinda Roan, PT, DPT Acute Rehabilitation Services Pager: [phone] Office: [phone]  Thelma Comp 11/10/2018, 3:18 PM

## 2018-11-10 NOTE — Progress Notes (Signed)
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 Estanislado Pandy. Patient awake and alert laying in bed with no complaints at this time. Accompanied by husband at bedside. Can spontaneously move all extremities. Speech clear. Right groin incision c/d/i with sheath in place. Moving off ICU today. Taking ASA/Brilinta as ordered   Allergies: Hydrocodone and Pseudoephedrine hcl  Medications:  Current Facility-Administered Medications:  .   stroke: mapping our early stages of recovery book, , Does not apply, Once, Amie Portland, MD .  acetaminophen (TYLENOL) tablet 650 mg, 650 mg, Oral, Q4H PRN **OR** acetaminophen (TYLENOL) solution 650 mg, 650 mg, Per Tube, Q4H PRN **OR** acetaminophen (TYLENOL) suppository 650 mg, 650 mg, Rectal, Q4H PRN, Deveshwar, Sanjeev, MD .  aspirin chewable tablet 81 mg, 81 mg, Oral, Daily, 81 mg at 11/09/18 1339 **OR** aspirin chewable tablet 81 mg, 81 mg, Per Tube, Daily, Deveshwar, Sanjeev, MD .  atorvastatin (LIPITOR) tablet 40 mg, 40 mg, Oral, q1800, Biby, Sharon L, NP, 40 mg at 11/09/18 1809 .  pantoprazole (PROTONIX) EC tablet 40 mg, 40 mg, Oral, Daily, Sethi, Pramod S, MD .  potassium chloride SA (K-DUR,KLOR-CON) CR tablet 20 mEq, 20 mEq, Oral, BID, Leonie Man, Pramod S, MD .  senna-docusate (Senokot-S) tablet 1 tablet, 1 tablet, Oral, QHS PRN, Amie Portland, MD .  ticagrelor [facility]) tablet 90 mg, 90 mg, Oral, BID, 90 mg at 11/09/18 2141 **OR** ticagrelor (BRILINTA) tablet 90 mg, 90 mg, Per Tube, BID, Deveshwar, Sanjeev, MD    Vital Signs: BP 113/71   Pulse 85   Temp 98 F (36.7 C) (Oral)   Resp 17   LMP 05/15/2017   SpO2 100%   Physical Exam Vitals signs and nursing note reviewed.  Constitutional:    General: She is not in acute distress.    Appearance: Normal appearance.  Pulmonary:     Effort: Pulmonary effort is normal. No respiratory distress.  Skin:    General: Skin is warm and dry.     Comments: Right groin incision soft with sheath intact, no active bleeding or hematoma.  Neurological:     Mental Status: She is alert.     Comments: Alert, awake, and oriented x3. Speech and comprehension intact. PERRL bilaterally. EOMs intact bilaterally without nystagmus or subjective diplopia. No facial asymmetry. Tongue midline. Can spontaneously move all extremities. No pronator drift. Fine motor and coordination intact and symmetric. Distal pulses 2+ bilaterally.  Psychiatric:        Mood and Affect: Mood normal.        Behavior: Behavior normal.        Thought Content: Thought content normal.        Judgment: Judgment normal.     Imaging: Ct Angio Head W Or Wo Contrast  Result Date: 11/08/2018 CLINICAL DATA:  43 y/o  F; code stroke, aphasia. EXAM: CT ANGIOGRAPHY HEAD AND NECK TECHNIQUE: Multidetector CT imaging of the head and neck was performed using the standard protocol during bolus administration of intravenous contrast. Multiplanar CT image reconstructions and MIPs were obtained to evaluate the vascular anatomy. Carotid stenosis measurements (when applicable) are obtained utilizing NASCET criteria, using the distal internal carotid diameter as the denominator. CONTRAST:  35mL OMNIPAQUE IOHEXOL 350 MG/ML SOLN COMPARISON:  11/08/2018 CT head FINDINGS: CTA NECK FINDINGS Aortic arch: Standard branching. Imaged portion  shows no evidence of aneurysm or dissection. No significant stenosis of the major arch vessel origins. Right carotid system: No evidence of dissection, stenosis (50% or greater) or occlusion. Left carotid system: Patent left common carotid artery. 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 Occlusion of the left  internal carotid artery from the C2 level to distal cavernous segment. Vertebral arteries: Codominant. No evidence of dissection, stenosis (50% or greater) or occlusion. Skeleton: Mild reversal of cervical curvature and multilevel discogenic degenerative changes. No acute osseous abnormality. No high-grade bony spinal canal stenosis. Other neck: Negative Upper chest: Negative. Review of the MIP images confirms the above findings CTA HEAD FINDINGS Anterior circulation: Occlusion of the left ICA from the C2 level to the distal cavernous segment. Patent paraclinoid and terminal left ICA, patent left PCOM, patent left ophthalmic artery. Occlusion of a left M2 anterolateral frontal branch (series 8, image 107). No additional area of significant stenosis, proximal occlusion, aneurysm, or vascular malformation. Posterior circulation: No significant stenosis, proximal occlusion, aneurysm, or vascular malformation. Venous sinuses: As permitted by contrast timing, patent. Anatomic variants: Fetal left PCA. No right posterior communicating artery identified, likely hypoplastic or absent. Patent anterior communicating artery. Review of the MIP images confirms the above findings IMPRESSION: CTA neck: 1. 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 Occlusion of left ICA from C2 to the distal cavernous segment. 2. No additional occlusion, dissection, aneurysm, or hemodynamically significant stenosis by NASCET criteria. CTA head: 1. Occlusion of left ICA from C2 level to the distal cavernous segment. 2. Occlusion of a left M2 anterolateral frontal branch. 3. Otherwise negative CTA of the head and neck. No additional large vessel occlusion, aneurysm, or significant stenosis. These results were called by telephone at the time of interpretation on 11/08/2018 at 7:35 pm to Dr. Archie Balboa, who verbally acknowledged these results. Electronically Signed   By: Kristine Garbe  M.D.   On: 11/08/2018 19:40   Ct Head Wo Contrast  Result Date: 11/09/2018 CLINICAL DATA:  Altered level of consciousness. EXAM: CT HEAD WITHOUT CONTRAST TECHNIQUE: Contiguous axial images were obtained from the base of the skull through the vertex without intravenous contrast. COMPARISON:  Head CT from yesterday FINDINGS: Brain: Cytotoxic edema in the left insula, frontal operculum, and now seen at the caudate head. No hemorrhagic complication. No significant mass effect. Vascular: No hyperdense vessel. Left ICA stent in the neck and skull base for dissection. Skull: Negative Sinuses/Orbits: Endoscopic sinus surgery with opacified left ethmoid air cell. IMPRESSION: Acute left MCA distribution infarct at the insula and frontal operculum. Mild involvement of the left caudate head that is newly visible. No hemorrhagic conversion. Electronically Signed   By: Monte Fantasia M.D.   On: 11/09/2018 05:26   Ct Angio Neck W Or Wo Contrast  Result Date: 11/08/2018 CLINICAL DATA:  43 y/o  F; code stroke, aphasia. EXAM: CT ANGIOGRAPHY HEAD AND NECK TECHNIQUE: Multidetector CT imaging of the head and neck was performed using the standard protocol during bolus administration of intravenous contrast. Multiplanar CT image reconstructions and MIPs were obtained to evaluate the vascular anatomy. Carotid stenosis measurements (when applicable) are obtained utilizing NASCET criteria, using the distal internal carotid diameter as the denominator. CONTRAST:  29mL OMNIPAQUE IOHEXOL 350 MG/ML SOLN COMPARISON:  11/08/2018 CT head FINDINGS: CTA NECK FINDINGS Aortic arch: Standard branching. Imaged portion shows no evidence of aneurysm or dissection. No significant stenosis  of the major arch vessel origins. Right carotid system: No evidence of dissection, stenosis (50% or greater) or occlusion. Left carotid system: Patent left common carotid artery. Tapering stenosis of the left internal carotid artery to occlusion at the C2 level  (series 7, image 117). Occlusion of the left internal carotid artery from the C2 level to distal cavernous segment. Vertebral arteries: Codominant. No evidence of dissection, stenosis (50% or greater) or occlusion. Skeleton: Mild reversal of cervical curvature and multilevel discogenic degenerative changes. No acute osseous abnormality. No high-grade bony spinal canal stenosis. Other neck: Negative Upper chest: Negative. Review of the MIP images confirms the above findings CTA HEAD FINDINGS Anterior circulation: Occlusion of the left ICA from the C2 level to the distal cavernous segment. Patent paraclinoid and terminal left ICA, patent left PCOM, patent left ophthalmic artery. Occlusion of a left M2 anterolateral frontal branch (series 8, image 107). No additional area of significant stenosis, proximal occlusion, aneurysm, or vascular malformation. Posterior circulation: No significant stenosis, proximal occlusion, aneurysm, or vascular malformation. Venous sinuses: As permitted by contrast timing, patent. Anatomic variants: Fetal left PCA. No right posterior communicating artery identified, likely hypoplastic or absent. Patent anterior communicating artery. Review of the MIP images confirms the above findings IMPRESSION: CTA neck: 1. 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 Occlusion of left ICA from C2 to the distal cavernous segment. 2. No additional occlusion, dissection, aneurysm, or hemodynamically significant stenosis by NASCET criteria. CTA head: 1. Occlusion of left ICA from C2 level to the distal cavernous segment. 2. Occlusion of a left M2 anterolateral frontal branch. 3. Otherwise negative CTA of the head and neck. No additional large vessel occlusion, aneurysm, or significant stenosis. These results were called by telephone at the time of interpretation on 11/08/2018 at 7:35 pm to Dr. Archie Balboa, who verbally acknowledged these results.  Electronically Signed   By: Kristine Garbe M.D.   On: 11/08/2018 19:40   Mr Brain Wo Contrast  Result Date: 11/09/2018 CLINICAL DATA:  Stroke follow-up.  Status post stent EXAM: MRI HEAD WITHOUT CONTRAST TECHNIQUE: Multiplanar, multiecho pulse sequences of the brain and surrounding structures were obtained without intravenous contrast. COMPARISON:  Head CT 11/09/2018 FINDINGS: BRAIN: There is abnormal diffusion restriction within the left MCA territory, predominantly involving the left frontal operculum and anterior insula, as well as the basal ganglia. The midline structures are normal. No midline shift or other mass effect. There is mild edema at the infarct site but the white matter is otherwise normal. The cerebral and cerebellar volume are age-appropriate. Susceptibility-sensitive sequences show no chronic microhemorrhage or superficial siderosis. VASCULAR: Major intracranial arterial and venous sinus flow voids are normal. SKULL AND UPPER CERVICAL SPINE: Calvarial bone marrow signal is normal. There is no skull base mass. Visualized upper cervical spine and soft tissues are normal. SINUSES/ORBITS: No fluid levels or advanced mucosal thickening. No mastoid or middle ear effusion. The orbits are normal. IMPRESSION: Medium-sized infarct within the left MCA territory, predominantly involving the frontal operculum and insula. No midline shift or acute hemorrhage. Left MCA flow void is normal. Electronically Signed   By: Ulyses Jarred M.D.   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 Previous aspects score of 6. EXAM: CT HEAD WITHOUT CONTRAST CT PERFUSION BRAIN TECHNIQUE: Multidetector CT imaging of the head was performed  using the standard protocol. Multiphase CT imaging of the brain was performed following IV bolus contrast injection.  Subsequent parametric perfusion maps were calculated using RAPID software. CONTRAST:  69mL ISOVUE-370 IOPAMIDOL (ISOVUE-370) INJECTION 76% COMPARISON:  Prior CT and CTA from earlier the same day. FINDINGS: CT HEAD FINDINGS Brain: Again seen is subtle loss of gray-white matter differentiation involving the left insula, overlying left frontal operculum, compatible with evolving acute left MCA territory infarct. The caudate and putamen appear relatively preserved on this examination as does the left internal capsule. Changes are somewhat more conspicuous from previous. Overall distribution remains fairly similar in size and morphology. Otherwise, appearance of the brain is stable and normal in appearance. No acute intracranial hemorrhage. No new large vessel territory infarct. No midline shift or mass effect. No hydrocephalus. No extra-axial fluid collection. Vascular: Persistent hyperdensity at the proximal left M2 branch, compatible with previously identified left M2 occlusion (series 6, image 43). Skull: Scalp soft tissues and calvarium demonstrate no acute finding. Sinuses/Orbits: Globes and orbital soft tissues within normal limits. Chronic left ethmoidal sinusitis with sequelae of prior left paranasal sinus surgery. Mastoid air cells are clear. Other: None. ASPECTS [facility] Stroke Program Early CT Score) - Ganglionic level infarction (caudate, lentiform nuclei, internal capsule, insula, M1-M3 cortex): 6 - Supraganglionic infarction (M4-M6 cortex): 2 Total score (0-10 with 10 being normal): 8 Review of the MIP images confirms the above findings CT Brain Perfusion Findings: CBF (<30%) Volume: 48mL Perfusion (Tmax>6.0s) volume: 76mL Mismatch Volume: 53mL Infarction Location:Core infarct involves the left insular and frontal operculum region. The left deep gray nuclei appear relatively spared. IMPRESSION: 1. Continued interval evolution of acute left MCA territory infarct involving the left insula and overlying left  frontal operculum. The left caudate and lentiform nuclei appear relatively preserved on this exam. Aspects calculated as 8, previously 6. 2. Persistent hyperdensity involving proximal left M2 branch, compatible with previously identified left M2 occlusion. 3. Acute core infarct involving the left insula and overlying left frontal operculum. Small surrounding penumbra as above. These results were communicated to Dr. Rory Percy at 10:19 pmon 2/13/2020by text page via the [facility] messaging system. Electronically Signed   By: Jeannine Boga M.D.   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 Previous aspects score of 6. EXAM: CT HEAD WITHOUT CONTRAST CT PERFUSION BRAIN TECHNIQUE: Multidetector CT imaging of the head was performed using the standard protocol. Multiphase CT imaging of the brain was performed following IV bolus contrast injection. Subsequent parametric perfusion maps were calculated using RAPID software. CONTRAST:  18mL ISOVUE-370 IOPAMIDOL (ISOVUE-370) INJECTION 76% COMPARISON:  Prior CT and CTA from earlier the same day. FINDINGS: CT HEAD FINDINGS Brain: Again seen is subtle loss of gray-white matter differentiation involving the left insula, overlying left frontal operculum, compatible with evolving acute left MCA territory infarct. The caudate and putamen appear relatively preserved on this examination as does the left internal capsule. Changes are somewhat more conspicuous from previous. Overall distribution remains fairly similar in size and morphology. Otherwise, appearance of the brain is stable and normal in appearance. No acute intracranial hemorrhage. No new large vessel territory infarct. No midline shift or mass effect. No hydrocephalus. No extra-axial fluid collection. Vascular: Persistent hyperdensity at the proximal left M2 branch,  compatible with previously identified left M2 occlusion (series 6, image 43). Skull: Scalp soft tissues and calvarium demonstrate  no acute finding. Sinuses/Orbits: Globes and orbital soft tissues within normal limits. Chronic left ethmoidal sinusitis with sequelae of prior left paranasal sinus surgery. Mastoid air cells are clear. Other: None. ASPECTS [facility] Stroke Program Early CT Score) - Ganglionic level infarction (caudate, lentiform nuclei, internal capsule, insula, M1-M3 cortex): 6 - Supraganglionic infarction (M4-M6 cortex): 2 Total score (0-10 with 10 being normal): 8 Review of the MIP images confirms the above findings CT Brain Perfusion Findings: CBF (<30%) Volume: 60mL Perfusion (Tmax>6.0s) volume: 14mL Mismatch Volume: 56mL Infarction Location:Core infarct involves the left insular and frontal operculum region. The left deep gray nuclei appear relatively spared. IMPRESSION: 1. Continued interval evolution of acute left MCA territory infarct involving the left insula and overlying left frontal operculum. The left caudate and lentiform nuclei appear relatively preserved on this exam. Aspects calculated as 8, previously 6. 2. Persistent hyperdensity involving proximal left M2 branch, compatible with previously identified left M2 occlusion. 3. Acute core infarct involving the left insula and overlying left frontal operculum. Small surrounding penumbra as above. These results were communicated to Dr. Rory Percy at 10:19 pmon 2/13/2020by text page via the [facility] messaging system. Electronically Signed   By: Jeannine Boga M.D.   On: 11/08/2018 22:21   Ct Head Code Stroke Wo Contrast  Result Date: 11/08/2018 CLINICAL DATA:  Code stroke.  Aphasia. EXAM: CT HEAD WITHOUT CONTRAST TECHNIQUE: Contiguous axial images were obtained from the base of the skull through the vertex without intravenous contrast. COMPARISON:  None. FINDINGS: Brain: There is loss of gray-white differentiation consistent with acute  infarction in the left MCA territory involving the insula, caudate head, lentiform nucleus, and frontal operculum. No intracranial hemorrhage, midline shift, or extra-axial fluid collection is identified. The ventricles and sulci are normal. Vascular: Hyperdense proximal left MCA. Skull: No fracture or focal osseous lesion. Sinuses/Orbits: Partially visualized left ethmoid air cell opacification. Clear mastoid air cells. Unremarkable included orbits. Other: None. ASPECTS [facility] Stroke Program Early CT Score) - Ganglionic level infarction (caudate, lentiform nuclei, internal capsule, insula, M1-M3 cortex): 3 - Supraganglionic infarction (M4-M6 cortex): 3 Total score (0-10 with 10 being normal): 6 IMPRESSION: 1. Acute nonhemorrhagic left MCA infarct with hyperdense proximal MCA. 2. ASPECTS is 6. These results were called by telephone at the time of interpretation on 11/08/2018 at 5:00 pm to Dr. Archie Balboa, who verbally acknowledged these results. Electronically Signed   By: Logan Bores M.D.   On: 11/08/2018 17:03    Labs:  CBC: Recent Labs    11/08/18 1641 11/09/18 0500 11/10/18 0609  WBC 9.3 11.1* 10.1  HGB 15.1* 12.8 11.1*  HCT 46.0 40.0 34.6*  PLT 221 181 160    COAGS: Recent Labs    11/08/18 1641  INR 1.01  APTT 32    BMP: Recent Labs    11/08/18 1641 11/09/18 0500 11/10/18 0609  NA 139 142 140  K 4.1 3.7 3.4*  CL 105 113* 113*  CO2 28 22 22   GLUCOSE 89 138* 90  BUN 15 7 8   CALCIUM 9.3 8.2* 7.7*  CREATININE 0.64 0.57 0.50  GFRNONAA >60 >60 >60  GFRAA >60 >60 >60    LIVER FUNCTION TESTS: Recent Labs    11/08/18 1641  BILITOT 0.6  AST 19  ALT 15  ALKPHOS 53  PROT 7.4  ALBUMIN 4.8    Assessment and Plan:  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 Estanislado Pandy. Patient's condition improving- can spontaneously move all extremities,  speech is clear. Right groin incision stable  with sheath intact- plan for sheath removal today. Continue taking Brilinta 90 mg twice daily and Aspirin 81 mg once daily Plan to follow-up with Dr. Estanislado Pandy in clinic 4 weeks after discharge. Appreciate and agree with neurology management. IR to follow.   Electronically Signed: Ascencion Dike, PA-C 11/10/2018, 11:52 AM   I spent a total of 25 Minutes at the the patient's bedside AND on the patient's hospital floor or unit, greater than 50% of which was counseling/coordinating care for left MCA M1 segment occlusion s/p revascularization.

## 2018-11-10 NOTE — Progress Notes (Signed)
PT Cancellation Note  Patient Details Name: Vanessa Sharp MRN: [id number] DOB: September 27, 1975   Cancelled Treatment:    Reason Eval/Treat Not Completed: Active bedrest order. Per last neuro note, sheath remains in place. Will continue to follow for readiness to participate with PT eval.    Thelma Comp 11/10/2018, 7:22 AM   Rolinda Roan, PT, DPT Acute Rehabilitation Services Pager: [phone] Office: [phone]

## 2018-11-10 NOTE — Progress Notes (Signed)
Rehab Admissions Coordinator Note:  Patient was screened by Cleatrice Burke for appropriateness for an Inpatient Acute Rehab Consult per PT rec.   At this time, we are recommending Inpatient Rehab consult.  Cleatrice Burke 11/10/2018, 4:54 PM  I can be reached at [phone].

## 2018-11-10 NOTE — Progress Notes (Signed)
Pt arrived on the unit. A/O, pleasant. No pain. Family at the bedside. Calm and cooperative. VS stable. Bed in low position, alarms are on, call in reach. Continue to monitor.

## 2018-11-11 ENCOUNTER — Telehealth (HOSPITAL_COMMUNITY): Payer: Self-pay | Admitting: Physical Medicine & Rehabilitation

## 2018-11-11 DIAGNOSIS — I6602 Occlusion and stenosis of left middle cerebral artery: Secondary | ICD-10-CM

## 2018-11-11 LAB — BASIC METABOLIC PANEL
Anion gap: 8 (ref 5–15)
BUN: 6 mg/dL (ref 6–20)
CALCIUM: 8.6 mg/dL — AB (ref 8.9–10.3)
CO2: 23 mmol/L (ref 22–32)
CREATININE: 0.49 mg/dL (ref 0.44–1.00)
Chloride: 109 mmol/L (ref 98–111)
GFR calc Af Amer: >60 mL/min (ref >60)
GFR calc non Af Amer: >60 mL/min (ref >60)
Glucose, Bld: 97 mg/dL (ref 70–99)
Potassium: 3.6 mmol/L (ref 3.5–5.1)
Sodium: 140 mmol/L (ref 135–145)

## 2018-11-11 LAB — GLUCOSE, CAPILLARY: Glucose-Capillary: 93 mg/dL (ref 70–99)

## 2018-11-11 MED ORDER — TICAGRELOR 90 MG PO TABS
90.0000 mg | ORAL_TABLET | Freq: Two times a day (BID) | ORAL | Status: DC
  Administered 2018-11-11: 90 mg via ORAL
  Filled 2018-11-11: qty 1

## 2018-11-11 MED ORDER — ATORVASTATIN CALCIUM 40 MG PO TABS: 40.0000 mg | ORAL_TABLET | Freq: Every day | ORAL | 11 refills | Status: DC

## 2018-11-11 MED ORDER — ASPIRIN EC 81 MG PO TBEC
81.0000 mg | DELAYED_RELEASE_TABLET | Freq: Every day | ORAL | Status: DC
  Administered 2018-11-11: 81 mg via ORAL
  Filled 2018-11-11: qty 1

## 2018-11-11 MED ORDER — ASPIRIN 81 MG PO TBEC: 81.0000 mg | DELAYED_RELEASE_TABLET | Freq: Every day | ORAL | Status: DC

## 2018-11-11 MED ORDER — ACETAMINOPHEN 325 MG PO TABS
650.0000 mg | ORAL_TABLET | Freq: Four times a day (QID) | ORAL | Status: DC | PRN
  Administered 2018-11-11: 650 mg via ORAL
  Filled 2018-11-11: qty 2

## 2018-11-11 MED ORDER — TICAGRELOR 90 MG PO TABS: 90.0000 mg | ORAL_TABLET | Freq: Two times a day (BID) | ORAL | 11 refills | Status: DC

## 2018-11-11 MED ORDER — ACETAMINOPHEN 325 MG PO TABS: 650.0000 mg | ORAL_TABLET | Freq: Four times a day (QID) | ORAL | Status: AC | PRN

## 2018-11-11 NOTE — Care Management (Addendum)
Referrals placed electronically for outpatient therapies, patient provided with Brilinta card. [facility] stated copay was $30 with no PA needed.

## 2018-11-11 NOTE — Discharge Instructions (Signed)
Alteplase Treatment for Ischemic Stroke  Alteplase is a medicine that can dissolve blood clots. An ischemic stroke is caused by blood clots that block blood flow to the brain. Alteplase can help treat a stroke if it is given very shortly after stroke symptoms begin. It is most effective when it is used within 0-4 hours after the start of symptoms. Before giving this treatment, the health care provider will carefully consider whether the treatment is appropriate based on your age, condition, and other factors. Tell a health care provider about:  Any allergies you have.  All medicines you are taking, including blood thinners, vitamins, herbs, and over-the-counter medicines.  Any medical conditions you have.  Any blood disorders you have.  Any active bleeding in the last 21 days, including gastrointestinal (GI) or vaginal bleeding.  Any surgeries or procedures you have had.  Whether you are pregnant or may be pregnant.  When your stroke symptoms started. What are the risks? Generally, this is a safe treatment. However, problems may occur, including:  Bleeding into the brain.  Bleeding in other parts of the body.  Allergic reaction. What happens before the treatment?  Your health care provider will perform a physical exam and take a detailed medical history.  Your blood pressure, heart rate, and breathing rate (vital signs) will be monitored closely. You may be given medicines to adjust blood pressure, if needed.  You may have tests, including: ? Blood tests. ? A CT scan. What happens during the treatment?  An IV will be inserted into one of your veins.  Alteplase will be given to you through this IV, usually over the course of 1 hour.  In some cases, this medicine may be given directly into the affected artery through a thin, flexible tube (catheter). This is usually inserted in the artery at the top of your leg.  Your vital signs and brain function will be monitored  closely as you receive this medicine.  If bleeding occurs, the medicine will be stopped and appropriate therapy will be started. What can I expect after treatment?  You will be monitored frequently by your health care team in an intensive care unit or a stroke unit.  You will be evaluated by a speech therapist, physical therapist, or an occupational therapist.  If you had a catheter, you may have bruising, soreness, and swelling at the catheter insertion site.  It may take several days, weeks, or even months to fully determine how you responded to the alteplase treatment. Follow these instructions in the hospital: Medicines  Take over-the-counter and prescription medicines only as told by your health care provider. Activity  Do not get out of bed without help. This is for your safety.  Limit activity after your treatment.  Participate in stroke rehabilitation programs as told by your health care provider. General instructions  Tell a nurse or health care provider right away if you have any bleeding, bruising or injuries.  Use a soft-bristled toothbrush. Brush your teeth gently. Get help right away if you have:  Blood in your vomit, stool, or urine.  A serious fall or accident, or you hit your head.  Symptoms of an allergic reaction such as rash or difficulty breathing.  Any symptoms of a stroke. "BE FAST" is an easy way to remember the main warning signs: ? B - Balance. Signs are dizziness, sudden trouble walking, or loss of balance. ? E - Eyes. Signs are trouble seeing or a sudden change in how you see. ?  F - Face. Signs are sudden weakness or loss of feeling in the face, or the face or eyelid drooping on one side. ? A - Arms. Signs are weakness or loss of feeling in an arm. This happens suddenly and usually on one side of the body. ? S - Speech. Signs are sudden trouble speaking, slurred speech, or trouble understanding what people say. ? T - Time. Time to call emergency  services. Write down what time symptoms started.  Other signs of stroke, such as: ? A sudden, very bad headache with no known cause. ? Nausea or vomiting. ? Seizure. These symptoms may represent a serious problem that is an emergency. Do not wait to see if the symptoms will go away. Get medical help right away.  Summary  Alteplase is a medicine that can dissolve blood clots. It can be used to open blocked arteries during an ischemic stroke.  To be effective, this medicine must be given very shortly after stroke symptoms begin, within 4 hours after the start of symptoms.  You will be monitored closely by your health care team during and after receiving alteplase.  Get help right away if you are showing signs of bleeding or are having symptoms of stroke. This information is not intended to replace advice given to you by your health care provider. Make sure you discuss any questions you have with your health care provider. Document Released: 02/29/2008 Document Revised: 10/10/2017 Document Reviewed: 10/10/2017 Elsevier Interactive Patient Education  2019 [employer].

## 2018-11-11 NOTE — Telephone Encounter (Signed)
Orders placed for outpt PT, SLP at [facility]
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
## 2018-11-11 NOTE — Progress Notes (Signed)
NURSING PROGRESS NOTE  Vanessa Sharp [id number] Discharge Data: 11/11/2018 4:07 PM Attending Provider: Garvin Fila, MD DBZ:MCEYEM, Lattie Haw, MD     Elwin Mocha to be D/C'd Home per MD order.  Discussed with the patient the After Visit Summary and all questions fully answered. All IV's discontinued with no bleeding noted. All belongings returned to patient for patient to take home.   Last Vital Signs:  Blood pressure 112/80, pulse 90, temperature 98.8 F (37.1 C), temperature source Oral, resp. rate 12, last menstrual period 05/15/2017, SpO2 100 %.  Discharge Medication List Allergies as of 11/11/2018      Reactions   Hydrocodone Nausea Only, Nausea And Vomiting   Pseudoephedrine Hcl Other (See Comments)   Pass out      Medication List    STOP taking these medications   rizatriptan 10 MG tablet Commonly known as:  MAXALT     TAKE these medications   acetaminophen 325 MG tablet Commonly known as:  TYLENOL Take 2 tablets (650 mg total) by mouth every 6 (six) hours as needed for mild pain, moderate pain, fever or headache.   aspirin 81 MG EC tablet Take 1 tablet (81 mg total) by mouth daily.   atorvastatin 40 MG tablet Commonly known as:  LIPITOR Take 1 tablet (40 mg total) by mouth daily at 6 PM.   ferrous sulfate 325 (65 FE) MG tablet Take 325 mg by mouth daily.   Melatonin 10 MG Caps Take 10 mg by mouth Nightly.   MULTI-VITAMIN DAILY Tabs Take 1 tablet by mouth daily.   ticagrelor 90 MG Tabs tablet Commonly known as:  BRILINTA Take 1 tablet (90 mg total) by mouth 2 (two) times daily.   VITAMIN D-1000 MAX ST 25 MCG (1000 UT) tablet Generic drug:  Cholecalciferol Take 1,000 mg by mouth daily.
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
## 2018-11-11 NOTE — Evaluation (Addendum)
Occupational Therapy Evaluation Patient Details Name: Vanessa Sharp MRN: [id number] DOB: 1976/07/15 Today's Date: 11/11/2018    History of Present Illness Pt is a 43 y/o female who presents s/p L MCA infarct 2 L ICA dissection s/p IV tPA and mechanical thrombectomy with L ICA stent placement. PMH significant for cervical CA s/p abdominal hysterectomy.   Clinical Impression   Pt admitted with above. She demonstrates the below listed deficits and will benefit from continued OT to maximize safety and independence with BADLs.  Pt presents to OT with communication deficits, higher level cognitive deficits - delayed processing, deficits with problem solving, and alternating and divided attention, as well as decrease activity tolerance.  Pt required min A for path finding activity, and was able to follow up to 3 step command.  She was able to navigate back to her room with min cues and supervision.  She requires supervision for ADLs.  Pt lives with sons and spouse, and was fully independent with ADLs and IADLs including managing [employer] student loan program.   Family is very supportive. Reviewed need for initial 24 hour supervision at discharge, and then likely intermittent supervision, as well as direct supervision with medication management, financial management, and cooking initially.   Discussed appropriate activity level at discharge.  Anticipate she will make good progress.     I have discussed the patient's current level of function related to current cognitive deficits, and ADLs with the patient spouse, and parents.  They acknowledge understanding of this and feel they can provide the level of care the patient will need at home.           Follow Up Recommendations  Outpatient OT;Supervision - 24 hour initially, then progressing to Intermittent supervision   Equipment Recommendations  None recommended by OT    Recommendations for Other Services       Precautions / Restrictions  Precautions Precautions: Fall Restrictions Weight Bearing Restrictions: No      Mobility   Transfers Overall transfer level: Needs assistance Equipment used: None Transfers: Sit to/from Stand;Stand Pivot Transfers Sit to Stand: Supervision Stand pivot transfers: Supervision            Balance Overall balance assessment: Needs assistance Sitting-balance support: Feet supported;No upper extremity supported Sitting balance-Leahy Scale: Good     Standing balance support: No upper extremity supported;During functional activity Standing balance-Leahy Scale: Good                             ADL either performed or assessed with clinical judgement   ADL Overall ADL's : Needs assistance/impaired Eating/Feeding: Independent   Grooming: Wash/dry hands;Wash/dry face;Oral care;Brushing hair;Supervision/safety;Standing   Upper Body Bathing: Set up;Supervision/ safety;Sitting;Standing   Lower Body Bathing: Supervison/ safety;Set up;Sit to/from stand   Upper Body Dressing : Supervision/safety;Set up;Sitting   Lower Body Dressing: Supervision/safety;Sit to/from stand   Toilet Transfer: Supervision/safety;Ambulation;Comfort height toilet   Toileting- Clothing Manipulation and Hygiene: Supervision/safety;Sit to/from stand       Functional mobility during ADLs: Supervision/safety General ADL Comments: family report pt has been performing toileting and grooming with supervision from them      Vision Baseline Vision/History: Wears glasses Wears Glasses: (at night time ) Patient Visual Report: No change from baseline Vision Assessment?: No apparent visual deficits Additional Comments: Pt reading without difficulty and located items in environment without signs of visual disturbances      Perception Perception Perception Tested?: Yes   Praxis Praxis Praxis  tested?: Within functional limits    Pertinent Vitals/Pain Pain Assessment: No/denies pain     Hand  Dominance Right   Extremity/Trunk Assessment Upper Extremity Assessment Upper Extremity Assessment: Overall WFL for tasks assessed   Lower Extremity Assessment Lower Extremity Assessment: Defer to PT evaluation   Cervical / Trunk Assessment Cervical / Trunk Assessment: Normal   Communication Communication Communication: Expressive difficulties   Cognition Arousal/Alertness: Awake/alert Behavior During Therapy: WFL for tasks assessed/performed Overall Cognitive Status: Impaired/Different from baseline Area of Impairment: Attention;Following commands;Problem solving                   Current Attention Level: Alternating   Following Commands: Follows one step commands consistently;Follows multi-step commands with increased time     Problem Solving: Slow processing General Comments: Pt followed 3 step commands during path finding activity with a delay.  She was able to utilize signage appropriately to locate 2 areas in hospital and navigate back to her room with supervision.  Pt requires min cues, and reported fatigue at end of session    General Comments  son, spouse, and parents present.  Discussed results of OT eval.  Discussed need for direct supervision with medication management, cooking, and finances at discharge, and distant supervision with all other ADLs except showers.  discussed appopriate activities at home.  Need for supervision in the community, and no driving or return to work until cleared by OP therapies.  They verbalized understanding of all     Exercises     Shoulder Instructions      Home Living Family/patient expects to be discharged to:: Private residence Living Arrangements: Spouse/significant other;Parent Available Help at Discharge: Family;Available 24 hours/day Type of Home: House Home Access: Stairs to enter [employer] of Steps: 5-6 around back Entrance Stairs-Rails: Right Home Layout: Two level;Able to live on main level with  bedroom/bathroom     Bathroom Shower/Tub: Occupational psychologist: Standard     Home Equipment: None   Additional Comments: Pt lives with spouse and 68 y.o and 34 y.o. sons.  Pt's spouse has taken a LOA and will be available 24/7 at discharge.       Prior Functioning/Environment Level of Independence: Independent        Comments: Works in Press photographer at [employer] List: Decreased strength;Decreased activity tolerance;Decreased cognition      OT Treatment/Interventions: Self-care/ADL training;DME and/or AE instruction;Therapeutic activities;Cognitive remediation/compensation;Patient/family education    OT Goals(Current goals can be found in the care plan section) Acute Rehab OT Goals Patient Stated Goal: To get back to normal  OT Goal Formulation: With patient/family Time For Goal Achievement: 11/25/18 Potential to Achieve Goals: Good ADL Goals Pt Will Perform Grooming: with modified independence;standing Pt Will Perform Upper Body Bathing: with modified independence;sitting;standing Pt Will Perform Lower Body Bathing: with modified independence;sit to/from stand Pt Will Perform Upper Body Dressing: with modified independence;sitting;standing Pt Will Perform Lower Body Dressing: with modified independence;sit to/from stand Pt Will Transfer to Toilet: with modified independence;ambulating;regular height toilet;grab bars Pt Will Perform Toileting - Clothing Manipulation and hygiene: with modified independence;sit to/from stand Pt Will Perform Tub/Shower Transfer: Tub transfer;Shower transfer;ambulating Additional ADL Goal #1: Pt will require supervision for moderately difficult path finding activity Additional ADL Goal #2: Pt will independently alternate and divide attention during simulated IADL tasks  OT Frequency: Min 2X/week   Barriers to D/C:            Co-evaluation  AM-PAC OT "6 Clicks" Daily Activity     Outcome Measure  Help from another person eating meals?: None Help from another person taking care of personal grooming?: None Help from another person toileting, which includes using toliet, bedpan, or urinal?: None Help from another person bathing (including washing, rinsing, drying)?: None Help from another person to put on and taking off regular upper body clothing?: None Help from another person to put on and taking off regular lower body clothing?: None 6 Click Score: 24   End of Session Nurse Communication: Mobility status  Activity Tolerance: Patient limited by fatigue Patient left: in chair;with call bell/phone within reach;with family/visitor present  OT Visit Diagnosis: Cognitive communication deficit (D03.013)                Time: 1438-8875 OT Time Calculation (min): 29 min Charges:  OT General Charges $OT Visit: 1 Visit OT Evaluation $OT Eval Moderate Complexity: 1 Mod OT Treatments $Therapeutic Activity: 8-22 mins  Lucille Passy, OTR/L Acute Rehabilitation Services Pager [phone] Office [phone]   Lucille Passy M 11/11/2018, 10:57 AM

## 2018-11-11 NOTE — Telephone Encounter (Signed)
OT referral made as well

## 2018-11-12 ENCOUNTER — Encounter (HOSPITAL_COMMUNITY): Payer: Self-pay | Admitting: Interventional Radiology

## 2018-11-13 ENCOUNTER — Other Ambulatory Visit (HOSPITAL_COMMUNITY): Payer: Self-pay | Admitting: Interventional Radiology

## 2018-11-13 DIAGNOSIS — I639 Cerebral infarction, unspecified: Secondary | ICD-10-CM

## 2018-11-22 ENCOUNTER — Telehealth: Payer: Self-pay | Admitting: *Deleted

## 2018-11-22 DIAGNOSIS — Z0289 Encounter for other administrative examinations: Secondary | ICD-10-CM

## 2018-11-22 NOTE — Telephone Encounter (Signed)
LVM unable to reach pt. Payment due before the form can be fill out.

## 2018-11-23 ENCOUNTER — Other Ambulatory Visit: Payer: Self-pay

## 2018-11-23 NOTE — Patient Outreach (Signed)
[facility]) Care Management  11/23/2018  [employer] 05-Jul-1976 [id number]   EMMI- Stroke not on APL RED ON EMMI ALERT Day # 3 Date: 11/22/2018 Red Alert Reason:  Feeling worse overall? yes  Outreach attempt: No answer.  Unable to leave a message.   Plan: RN CM will attempt patient again within 4 business days.    Jone Baseman, RN, MSN [facility] Care Management Care Management Coordinator Direct Line [phone] Toll Free: [phone]  Fax: [phone]

## 2018-11-26 ENCOUNTER — Other Ambulatory Visit: Payer: Self-pay

## 2018-11-26 NOTE — Telephone Encounter (Signed)
Form only done for while hospitalized. Fee paid given to [facility] for faxing.

## 2018-11-26 NOTE — Patient Outreach (Signed)
[facility]) Care Management  11/26/2018  Vanessa Sharp [facility] 04-08-1976 [id number]   EMMI- Stroke not on APL RED ON EMMI ALERT Day # 3 Date:11/22/2018 Red Alert Reason: Feeling worse overall? yes  Outreach attempt: spoke with patient.  She reports that she is feeling better daily.  Addressed red alert with patient.  She states that the machine misunderstood her response and that she is doing good.  Patient has follow up with her doctor on Thursday and spouse will be taking her.  She declines any other question, concerns, or needs.    Plan: RN CM will close case.  Jone Baseman, RN, MSN [facility] Care Management Coordinator Direct Line [phone] Cell [phone] Toll Free: [phone]  Fax: [phone]

## 2018-11-27 ENCOUNTER — Ambulatory Visit: Payer: BC Managed Care – PPO

## 2018-11-29 ENCOUNTER — Ambulatory Visit: Payer: BC Managed Care – PPO | Attending: Physical Medicine & Rehabilitation | Admitting: Occupational Therapy

## 2018-11-29 ENCOUNTER — Ambulatory Visit: Payer: BC Managed Care – PPO | Admitting: Physical Therapy

## 2018-11-29 ENCOUNTER — Ambulatory Visit: Payer: BC Managed Care – PPO | Admitting: Speech Pathology

## 2018-11-29 ENCOUNTER — Encounter: Payer: Self-pay | Admitting: Physical Therapy

## 2018-11-29 ENCOUNTER — Other Ambulatory Visit: Payer: Self-pay

## 2018-11-29 DIAGNOSIS — R41841 Cognitive communication deficit: Secondary | ICD-10-CM

## 2018-11-29 DIAGNOSIS — I69315 Cognitive social or emotional deficit following cerebral infarction: Secondary | ICD-10-CM | POA: Diagnosis not present

## 2018-11-29 DIAGNOSIS — R29818 Other symptoms and signs involving the nervous system: Secondary | ICD-10-CM | POA: Insufficient documentation

## 2018-11-29 NOTE — Therapy (Signed)
[facility] [address] [place], [place], [address] Phone: [phone]   Fax:  [phone]  Physical Therapy Evaluation  Patient Details  Name: Vanessa Sharp MRN: [id number] Date of Birth: [birth year] Referring Provider (PT): Vickey Sages Date: 11/29/2018  PT End of Session - 11/29/18 1043    Visit Number  1    Number of Visits  1    PT Start Time  0856    PT Stop Time  0920    PT Time Calculation (min)  24 min    Activity Tolerance  Patient tolerated treatment well    Behavior During Therapy  [facility] for tasks assessed/performed       History reviewed. No pertinent past medical history.  Past Surgical History:  Procedure Laterality Date  . ABDOMINAL HYSTERECTOMY  09/21/2017   cervical cancer  . IR ANGIO INTRA EXTRACRAN SEL COM CAROTID INNOMINATE UNI R MOD SED  11/09/2018  . IR INTRA CRAN STENT  11/09/2018  . IR INTRAVSC STENT CERV CAROTID W/O EMB-PROT MOD SED INC ANGIO  11/09/2018  . IR PERCUTANEOUS ART THROMBECTOMY/INFUSION INTRACRANIAL INC DIAG ANGIO  11/09/2018  . RADIOLOGY WITH ANESTHESIA N/A 11/08/2018   Procedure: IR WITH ANESTHESIA;  Surgeon: Luanne Bras, MD;  Location: [place];  Service: Radiology;  Laterality: N/A;    There were no vitals filed for this visit.   Subjective Assessment - 11/29/18 0858    Subjective  11/08/2018 carotid dissection s/p tPA and stent procedure.  No issues with balance or strength at this point.  Have been out on the baseball field with sons and not having a problem with balance.      Patient is accompained by:  Family member   Husband   Patient Stated Goals  Cannot think of anything I need to work on for PT    Currently in Pain?  No/denies         [facility] PT Assessment - 11/29/18 0902      Assessment   Medical Diagnosis  LMCA CVA ,s/p L ICA dissection,     Referring Provider (PT)  Naaman Plummer    Onset Date/Surgical Date  11/08/18   11/11/2018 d/c       Precautions   Precautions  --   Avoid heavy lifting   Precaution Comments  not driving currently      Balance Screen   Has the patient fallen in the past 6 months  No    Has the patient had a decrease in activity level because of a fear of falling?   No    Is the patient reluctant to leave their home because of a fear of falling?   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    Leisure  Kids' sports activities, enjoyed working out in mornings, walking      Observation/Other Assessments   Focus on  Therapeutic Outcomes (FOTO)   NA-pt does not require follow-up therapy      Posture/Postural Control   Posture/Postural Control  No significant limitations      ROM / Strength   AROM / PROM / Strength  Strength      Strength   Overall Strength  Within functional limits for tasks performed      Transfers   Transfers  Sit to Stand;Stand to Sit    Sit to Stand  7: Independent    Five time sit to stand comments   9.9    Stand to Sit  7: Independent      Ambulation/Gait   Ambulation/Gait  Yes    Ambulation/Gait Assistance  7: Independent    Ambulation Distance (Feet)  200 Feet    Assistive device  None    Gait Pattern  Within Functional Limits    Ambulation Surface  Level;Indoor    Gait velocity  9.6 sec = 3.42 ft/sec      Standardized Balance Assessment   Standardized Balance Assessment  Timed Up and Go Test      Timed Up and Go Test   Normal TUG (seconds)  9.9    Manual TUG (seconds)  9.4    Cognitive TUG (seconds)   11.19    TUG Comments  Scores >13-15 seconds indicate increased fall risk.      High Level Balance   High Level Balance Comments  Single limb stance, RLE and LLE >10 seconds; pt able to stand EO and EC solid and compliant surface 30 seconds each, no LOB, no significant sway      Functional Gait  Assessment   Gait assessed   Yes    Gait Level Surface  Walks 20 ft in less than 5.5 sec, no assistive devices, good speed, no evidence for imbalance, normal gait pattern, deviates no more than 6 in outside of the 12 in walkway width.    Change in Gait Speed  Able to smoothly change walking speed without loss of balance or gait deviation. Deviate no more than 6 in outside of the 12 in walkway width.    Gait with Horizontal Head Turns  Performs head turns smoothly with no change in gait. Deviates no more than 6 in outside 12 in walkway width    Gait with Vertical Head Turns  Performs head turns with no change in gait. Deviates no more than 6 in outside 12 in walkway width.    Gait and Pivot Turn  Pivot turns safely within 3 sec and stops quickly with no loss of balance.    Step Over Obstacle  Is able to step over 2 stacked shoe boxes taped together (9 in total height) without changing gait speed. No evidence of imbalance.    Gait with Narrow Base of Support  Is able to ambulate for 10 steps heel to toe with no staggering.    Gait with Eyes Closed  Walks 20 ft, no assistive devices, good speed, no evidence of imbalance, normal gait pattern, deviates no more than 6 in outside 12 in walkway width. Ambulates 20 ft in less than 7 sec.    Ambulating Backwards  Walks 20 ft, no assistive devices, good speed, no evidence for imbalance, normal gait    Steps  Alternating feet, must use rail.    Total Score  29    FGA comment:  Scores <22/30 indicate increased fall risk  Objective measurements completed on examination: See above findings.                            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  Pt reports initial speech and R sided weakness, but this has resolved and pt notes no remaining strength or balance deficits.  In all objective measures, pt demonstrates balance, strength, gait WNL and is not at a fall risk.  She does not appear to have any further skilled PT needs at this time.    Personal Factors and Comorbidities  Comorbidity 1    Comorbidities  hx hysterectomy/cancer    Stability/Clinical Decision Making  Stable/Uncomplicated    Clinical Decision Making  Low    PT Frequency  One time visit    PT Next Visit Plan  Eval only, as pt does not appear to have any further skilled PT needs.    Consulted and Agree with Plan of Care  Patient;Family member/caregiver    Family Member Consulted  Husband       Patient will benefit from skilled therapeutic intervention in order to improve the following deficits and impairments:     Visit Diagnosis: Other symptoms and signs involving the nervous system     Problem List Patient Active Problem List   Diagnosis Date Noted  . 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 Acute ischemic stroke ([place]) 11/08/2018    Xzandria Clevinger W. 11/29/2018, 10:49 AM  Frazier Butt., PT   [facility] [address] [place], [place], [address] Phone: [phone]   Fax:  [phone]  Name: Alleigh Mollica MRN: [id number] Date of Birth: [birth year]

## 2018-11-29 NOTE — Patient Instructions (Signed)
  Repeat back what you have heard to make sure you got it all - "What I think you are saying is...."  At work, limit distractions - have a set time to check emails, so you are not going back and forth between work, emails and voicemails - this can affect your attention and processing  When you are working on a long task set a timer for 20 minutes and take a brain break when the timer goes off - in quiet place for a few minutes - whatever feels good to you  Have your desk/workspace organized with reduced clutter to help your focus  Group conversations will be harder for you - let your family and friends know that you are are having fatigue and difficulty with attention and processing  Best to have important conversations in quiet environments, with no distractions and be face to face   Tips to help facilitate better attention, concentration, focus   Do harder, longer tasks when you are most alert/awake  Break down larger tasks into small parts  Limit distractions of TV, radio, conversation, e mails/texts, appliance noise, etc - if a job is important, do it in a quiet room  Be aware of how you are functioning in high stimulation environments such as large stores, parties, restaurants - any place with lots of lights, noise, signs etc  Group conversations may be more difficult to process than one on one conversations  Give yourself extra time to process conversation, reading materials, directions or information from your healthcare providers  Organization is key - clutters of laundry, mail, paperwork, dirty dishes - all make it more difficult to concentrate  Before you start a task, have all the needed supplies, directions, recipes ready and organized. This way you don't have to go looking for something in the middle of a task and become distracted.   Be aware of fatigue - take rests or breaks when needed to re-group and re-focus   Listen to podcasts/TED talks - take notes - go back  and re-listen to see how much you got   Keep a log/rating scale to give yourself a grade/feedback

## 2018-11-30 NOTE — Therapy (Signed)
[facility] [address] [place], [place], [address] Phone: [phone]   Fax:  [phone]  Occupational Therapy Treatment  Patient Details  Name: Vanessa Sharp MRN: [id number] Date of Birth: 1976-02-03 Referring Provider (OT): Dr. Naaman Plummer   Encounter Date: 11/29/2018  OT End of Session - 11/30/18 0832    Visit Number  1    Number of Visits  1    Authorization Type  [employer]    OT Start Time  0806    OT Stop Time  0845    OT Time Calculation (min)  39 min    Activity Tolerance  Patient tolerated treatment well    Behavior During Therapy  [facility] for tasks assessed/performed       No past medical history on file.  Past Surgical History:  Procedure Laterality Date  . ABDOMINAL HYSTERECTOMY  09/21/2017   cervical cancer  . IR ANGIO INTRA EXTRACRAN SEL COM CAROTID INNOMINATE UNI R MOD SED  11/09/2018  . IR INTRA CRAN STENT  11/09/2018  . IR INTRAVSC STENT CERV CAROTID W/O EMB-PROT MOD SED INC ANGIO  11/09/2018  . IR PERCUTANEOUS ART THROMBECTOMY/INFUSION INTRACRANIAL INC DIAG ANGIO  11/09/2018  . RADIOLOGY WITH ANESTHESIA N/A 11/08/2018   Procedure: IR WITH ANESTHESIA;  Surgeon: Luanne Bras, MD;  Location: [facility];  Service: Radiology;  Laterality: N/A;    There were no vitals filed for this visit.  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  No/denies         [facility] OT Assessment - 11/30/18 0001      Assessment   Medical Diagnosis  LMCA CVA ,s/p L ICA dissection,     Referring Provider (OT)  Dr. Naaman Plummer    Onset Date/Surgical Date  11/11/18      Precautions   Precaution  Comments  not driving currently      Balance Screen   Has the patient fallen in the past 6 months  No    Has the patient had a decrease in activity level because of a fear of falling?   No    Is the patient reluctant to leave their home because of a fear of falling?   No      Home  Environment   Family/patient expects to be discharged to:  Private residence    Lives With  Family      Prior Function   Level of Independence  Independent    Vocation  Full time employment    Human resources officer program      ADL   ADL comments  modified independent with all basic [place] for transportation    Meal Prep  Plans, prepares and serves adequate meals independently      Mobility   Mobility Status  Independent      Vision Assessment   Saccades  Within functional limits    Visual Fields  No apparent deficits    Comment  No apparent visual deficits  Cognition   Overall Cognitive Status  Impaired/Different from baseline    Memory  Decreased short-term memory    Memory Impairment  Decreased recall of new information    Cognition Comments  Pt performed mod complex organization task without error, she reports that she had to spend more time reading items. Ambulating while tossing a ball and counting backwards by 5's without difficulty. Pt perfromed symbol digits modailty task with !00% accuracy and good speed. Pt reports feeeling foggy and experiencing word finding difficulties. Therapist to defer to [place].      Coordination   Gross Motor Movements are Fluid and Coordinated  Yes    Fine Motor Movements are Fluid and Coordinated  Yes    9 Hole Peg Test  Right;Left    Right 9 Hole Peg Test  18.16 secs    Left 9 Hole Peg Test  19.72 secs      ROM / Strength   AROM / PROM / Strength  AROM;Strength      AROM   Overall AROM   Within functional limits for tasks performed      Strength   Overall Strength  Within functional limits for tasks  performed      Hand Function   Right Hand Grip (lbs)  58 lbs    Left Hand Grip (lbs)  53 lbs               Therap spoke with pt and husband regarding driving and recommended pt seeks MD clearance then begins a graduated driving program with a licensed driver.             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  Pt presents to occupational therapy with mild higher level cognitive-linguistic deficits. These deficits to be addressed by ST therapy.  Pt reports she  is perfroming ADLS and IADLS independently with the exception of driving. Occupational therapy will sign of at this time as pt does not demonstrate residual strength or coordination deficits.    OT Occupational Profile and History  Detailed Assessment- Review of Records and additional review of physical, cognitive, psychosocial history related to current functional performance    Occupational performance deficits (Please refer to evaluation for details):  ADL's;IADL's;Work;Leisure;Social Participation    Body Structure / Function / Physical Skills  IADL    Cognitive Skills  Memory    Rehab Potential  Good    Clinical Decision Making  Limited treatment options, no task modification necessary    Comorbidities Affecting Occupational Performance:  None    Modification or Assistance to Complete Evaluation   No modification of tasks or assist necessary to complete eval    OT Frequency  One time visit    OT Treatment/Interventions  Self-care/ADL training    Plan  Pt was seen for occupational therapy evaluation. Pt has resumed performance of ADLS/IADLs at an independent level. Pt demonstrates mild cognitive linguistic deficits which will be  addressed by ST. OT to sign off at this time.    Consulted and Agree with Plan of Care  Patient;Family member/caregiver  Family Member Consulted  husband.       Patient will benefit from skilled therapeutic intervention in order to improve the following deficits and impairments:  Cognitive Skills  Visit Diagnosis: Cognitive social or emotional deficit following cerebral infarction - Plan: Ot plan of care cert/re-cert    Problem List Patient Active Problem List   Diagnosis Date Noted  . 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 Acute ischemic stroke ([place]) 11/08/2018    Jona Erkkila 11/30/2018, 1:17 PM Theone Murdoch, OTR/L [phone] Phone: [phone] 1:17 PM 11/30/18 [facility] [address] [place], [place], [address] Phone: [phone]   Fax:  [phone]  Name: Vanessa Sharp MRN: [id number] Date of Birth: [birth year]

## 2018-12-03 NOTE — Therapy (Signed)
[facility] [address] [place], [place], [address] Phone: [phone]   Fax:  [phone]  Speech Language Pathology Evaluation  Patient Details  Name: Vanessa Sharp MRN: [id number] Date of Birth: September 08, 1976 Referring Provider (SLP): Dr. Alger Sharp   Encounter Date: 11/29/2018    No past medical history on file.  Past Surgical History:  Procedure Laterality Date  . ABDOMINAL HYSTERECTOMY  09/21/2017   cervical cancer  . IR ANGIO INTRA EXTRACRAN SEL COM CAROTID INNOMINATE UNI R MOD SED  11/09/2018  . IR INTRA CRAN STENT  11/09/2018  . IR INTRAVSC STENT CERV CAROTID W/O EMB-PROT MOD SED INC ANGIO  11/09/2018  . IR PERCUTANEOUS ART THROMBECTOMY/INFUSION INTRACRANIAL INC DIAG ANGIO  11/09/2018  . RADIOLOGY WITH ANESTHESIA N/A 11/08/2018   Procedure: IR WITH ANESTHESIA;  Surgeon: Vanessa Bras, MD;  Location: [facility];  Service: Radiology;  Laterality: N/A;    There were no vitals filed for this visit.      SLP Evaluation [facility] - 12/03/18 8182      SLP Visit Information   SLP Received On  11/29/18    Referring Provider (SLP)  Dr. Alger Sharp    Onset Date  11/08/18    Medical Diagnosis  Left MCA CVA      Subjective   Subjective  "I feel like my memory has changed"    Patient/Family Stated Goal  To be able to develop thoughts and have conversation without a delay      Pain Assessment   Currently in Pain?  No/denies      General Information   HPI  Pt is a 43 y.o. female who presented to [facility] for sudden onset of word finding difficulty and confusion. She was evaluated by telemedicine neurology. IV TPA was administered on 11/08/18. CT of the head of 11/09/18 revealed acute left MCA distribution infarct at the insula and frontal operculum with mild involvement of the left caudate head.     Mobility Status  walks independently      Balance Screen   Has the patient fallen in the past 6  months  No    Has the patient had a decrease in activity level because of a fear of falling?   No    Is the patient reluctant to leave their home because of a fear of falling?   No      Prior Functional Status   Cognitive/Linguistic Baseline  Within functional limits      Cognition   Overall Cognitive Status  Impaired/Different from baseline    Area of Impairment  Following commands;Attention;Memory    Current Attention Level  Alternating    Memory  Decreased short-term memory    Attention  Alternating    Alternating Attention  Impaired    Alternating Attention Impairment  Verbal complex;Functional complex    Memory  Impaired    Memory Impairment  Decreased recall of new information    Awareness  Appears intact    Problem Solving  Impaired    Problem Solving Impairment  Verbal complex;Functional complex    Herbalist;Initiating;Organizing      Auditory Comprehension   Overall Auditory Comprehension  Impaired    Yes/No Questions  Impaired    Paragraph Comprehension (via yes/no questions)  51-75% accurate    Conversation  Simple    Interfering Components  Attention;Processing speed    EffectiveTechniques  Extra processing time;Repetition;Slowed speech      Reading Comprehension  Reading Status  Not tested    Functional Environmental (signs, name badge)  Not tested      Expression   Primary Mode of Expression  Verbal      Verbal Expression   Overall Verbal Expression  Appears within functional limits for tasks assessed      Written Expression   Dominant Hand  Right    Written Expression  Not tested      Oral Motor/Sensory Function   Overall Oral Motor/Sensory Function  Appears within functional limits for tasks assessed      Motor Speech   Overall Motor Speech  Appears within functional limits for tasks assessed      Standardized Assessments   Standardized Assessments   Cognitive Linguistic Quick Test      Cognitive Linguistic Quick Test (Ages  18-69)   Attention  WNL    Memory  Mild    Executive Function  WNL    Language  WNL    Visuospatial Skills  WNL    Severity Rating Total  19    Composite Severity Rating  15.8                        SLP Short Term Goals - 12/03/18 0740      SLP SHORT TERM GOAL #1   Title  Pt will utilize compensatory strategies to attend to and recall moderately complex conversation over 10 minutes with rare min A, repeating salient details of conversation    Time  3    Period  Weeks    Status  New      SLP SHORT TERM GOAL #2   Title  Pt will respond to social or work related emails with compensatory strategies with no errors over 2 sessions    Time  3    Period  Weeks    Status  New       SLP Long Term Goals - 12/03/18 0743      SLP LONG TERM GOAL #1   Title  Pt will utilize internal and external strategies to comprehend complex conversation over 15 minutes repeating details with rare min A    Time  6    Period  Weeks    Status  New      SLP LONG TERM GOAL #2   Title  Pt will generate complex work related emails with no errors and min A from compensatory strategies over 3 sessions    Time  [place] - 12/03/18 0747    Clinical Impression Statement  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 She is accompanied by her husband, Vanessa Sharp. They both report slowed processing, slow response time. Pt reports she is having difficulty responding to emails  and "doesn't know how to start them." Pt has filed for FMLA at this time, but is trying to respond to work emails at home.  She is cooking, cleaning, managing finances and medications successfully.  She has been able to help her 42 year old with his homework. [name] primary complaint is being able to follow conversations as quickly as she did prior to the CVA. The Cognitive Linguistic Quick Test (CLQT) was  administered. Pt scored WNL on all subtests except memory, which was mildly impaired. Her greatest difficulty was on  the Story Retell subtest, where she expressed frustration that "I didn't get any more of it"  I question if this is true memory vs auditory comprehension or processing/attention impairment. Although language was WNL, it was low WNL. Word finding difficulties were not appreciated during this evaluation and pt does not feel like she is having word finding difficulties. She works at [employer] in the [employer]. I recommend skilled ST to maximize cognition and carryover of compensations for cognitive linguistic impairment for successful return to work and QOL.     Speech Therapy Frequency  2x / week    Duration  --   6 weeks or 13 visits   Treatment/Interventions  Compensatory strategies;Cognitive reorganization;Environmental controls;Compensatory techniques;Cueing hierarchy;Internal/external aids;Functional tasks;SLP instruction and feedback;Patient/family education    Potential to Achieve Goals  Good    Consulted and Agree with Plan of Care  Patient;Family member/caregiver    Family Member Consulted  spouse, Vanessa Sharp       Patient will benefit from skilled therapeutic intervention in order to improve the following deficits and impairments:   Cognitive communication deficit    Problem List Patient Active Problem List   Diagnosis Date Noted  . 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 Acute ischemic stroke ([place]) 11/08/2018    , Annye Rusk MS, CCC-SLP 12/03/2018, 7:48 AM  [facility] [address] [place], [place], [address] Phone: [phone]   Fax:  [phone]  Name: Vanessa Sharp MRN: [id number] Date of Birth: 1976/06/20

## 2018-12-10 ENCOUNTER — Ambulatory Visit (HOSPITAL_COMMUNITY)
Admission: RE | Admit: 2018-12-10 | Discharge: 2018-12-10 | Disposition: A | Discharge status: Home / Self Care | Payer: BC Managed Care – PPO | Source: Ambulatory Visit | Attending: Interventional Radiology | Admitting: Interventional Radiology

## 2018-12-10 ENCOUNTER — Other Ambulatory Visit: Payer: Self-pay

## 2018-12-10 ENCOUNTER — Telehealth: Payer: Self-pay | Admitting: Neurology

## 2018-12-10 DIAGNOSIS — I639 Cerebral infarction, unspecified: Secondary | ICD-10-CM

## 2018-12-10 NOTE — Telephone Encounter (Signed)
Manuela Schwartz from [facility] called in and stated Pt is on ticagrelor (BRILINTA) 90 MG TABS tablet and is have brain fog , diff speaking and thinking after she takes it. She wants to know if med needs to be stopped and changed to something else .

## 2018-12-10 NOTE — Progress Notes (Signed)
Chief Complaint: Patient was seen today for routine 4 week stroke follow up.  Supervising Physician: Luanne Bras  Patient Status: [facility] - Out-pt  History of Present Illness: Vanessa Sharp is a 43 y.o. 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 Estanislado Pandy who presents today for routine 4 week stroke follow up.  Briefly, patient originally presented to [facility] ED on 11/08/18 with complaints of aphasia - CT head showed acute nonhemorrhagic left MCA infarct with hyperdense proximal MCA, ASPECTS 6. 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 She was given tPA and transferred to [facility]. She underwent emergent mechanical thrombectomy of the left MCA M1 segment achieving TICI 3 revascularization and telescoping stent/flow diverter placement at the long segment dissection of the left ICA. She was extubated without issue and did not exhibit any neurological deficits post procedure. Her hospital course was uncomplicated and she was discharged to home on 11/11/18 with outpatient PT/OT/SLP and instructions to continue Brilinta 90 mg BID + ASA 81 mg QD.  Patient presents today with her husband and mother - she denies any complaints today. Her husband reports that her speech has improved greatly and appears to be back at baseline, however he believes that she is a little slower at processing information than previously. Her mother reports concern regarding her processing speed, mixing up words and decreased appetite however she does note that these are all improving. Patient's mother has many concerns today including family history of stroke  (patient's grandmother deceased from stroke at age 45), need for closer monitoring at PCP, prevalence of FMD and if FMD could be found in other areas of her body and results of echocardiogram performed in hospital and if there could have been damage to her heart from the stroke. Vanessa Sharp does not have any questions today and reports that she has been continuing with speech with plans to continue for 4-6 weeks, but was evaluated by OT/PT and was found not to require their services. She works at [employer] in [employer] and is looking forward to returning from work, she has already been cleared to work from home due to [place]. She continues to take Brilinta 90 mg BID + ASA 81 mg QD - she denies any dyspnea or bleeding but does endorse easy bruising on her legs. She also takes Lipitor for cholesterol. She does not have a history of HTN or DM. She does not drink alcohol or use tobacco products.   No past medical history on file.  Past Surgical History:  Procedure Laterality Date   ABDOMINAL HYSTERECTOMY  09/21/2017   cervical cancer   IR ANGIO INTRA EXTRACRAN SEL COM CAROTID INNOMINATE UNI R MOD SED  11/09/2018   IR INTRA CRAN STENT  11/09/2018   IR INTRAVSC STENT CERV CAROTID W/O EMB-PROT MOD SED INC ANGIO  11/09/2018   IR PERCUTANEOUS ART THROMBECTOMY/INFUSION INTRACRANIAL INC DIAG ANGIO  11/09/2018   RADIOLOGY WITH ANESTHESIA N/A 11/08/2018   Procedure: IR WITH ANESTHESIA;  Surgeon: Luanne Bras, MD;  Location: [facility];  Service: Radiology;  Laterality: N/A;    Allergies: Hydrocodone and Pseudoephedrine hcl  Medications: Prior to Admission medications   Medication Sig Start Date End Date Taking?  Authorizing Provider  acetaminophen (TYLENOL) 325 MG tablet Take 2 tablets (650 mg total) by mouth every 6 (six) hours as needed for mild pain, moderate pain, fever or headache. 11/11/18   Rinehuls, Early Chars, PA-C  aspirin EC 81 MG EC tablet Take 1 tablet (81 mg total) by mouth daily. 11/11/18    Rinehuls, Early Chars, PA-C  atorvastatin (LIPITOR) 40 MG tablet Take 1 tablet (40 mg total) by mouth daily at 6 PM. 11/11/18   Rinehuls, Early Chars, PA-C  Cholecalciferol (VITAMIN D-1000 MAX ST) 25 MCG (1000 UT) tablet Take 1,000 mg by mouth daily.    [provider]  ferrous sulfate 325 (65 FE) MG tablet Take 325 mg by mouth daily.    [provider]  Melatonin 10 MG CAPS Take 10 mg by mouth Nightly.    [provider]  Multiple Vitamin (MULTI-VITAMIN DAILY) TABS Take 1 tablet by mouth daily.    [provider]  ticagrelor (BRILINTA) 90 MG TABS tablet Take 1 tablet (90 mg total) by mouth 2 (two) times daily. 11/11/18   Rinehuls, Early Chars, PA-C     Family History  Problem Relation Age of Onset   Breast cancer Mother     Social History   Socioeconomic History   Marital status: Married    Spouse name: Not on file   Number of children: Not on file   Years of education: Not on file   Highest education level: Not on file  Occupational History   Not on file  Social Needs   Financial resource strain: Not on file   Food insecurity:    Worry: Not on file    Inability: Not on file   Transportation needs:    Medical: Not on file    Non-medical: Not on file  Tobacco Use   Smoking status: Not on file  Substance and Sexual Activity   Alcohol use: Not on file   Drug use: Not on file   Sexual activity: Not on file  Lifestyle   Physical activity:    Days per week: Not on file    Minutes per session: Not on file   Stress: Not on file  Relationships   Social connections:    Talks on phone: Not on file    Gets together: Not on file    Attends religious service: Not on file    Active member of club or organization: Not on file    Attends meetings of clubs or organizations: Not on file    Relationship status: Not on file  Other Topics Concern   Not on file  Social History Narrative   Not on file     Review of Systems: A 12 point ROS  discussed and pertinent positives are indicated in the HPI above.  All other systems are negative.  Review of Systems  Constitutional: Positive for appetite change (Decreased). Negative for chills, fatigue and fever.  HENT: Negative for hearing loss, tinnitus and trouble swallowing.   Eyes: Negative for photophobia, pain, redness and visual disturbance.  Respiratory: Negative for cough and shortness of breath.   Cardiovascular: Negative for chest pain.  Gastrointestinal: Negative for abdominal pain, diarrhea, nausea and vomiting.  Musculoskeletal: Negative for back pain.  Neurological: Positive for speech difficulty ("slow processing speed", mixing up words at times -- mild; improving per family) and headaches (occasional - none since intervention). Negative for weakness.  Hematological: Bruises/bleeds easily.  Psychiatric/Behavioral: Negative for confusion.    Vital Signs: LMP 05/15/2017  Physical Exam Constitutional:      General: She is not in acute distress. HENT:     Head: Normocephalic.  Pulmonary:     Effort: Pulmonary effort is normal.  Skin:    General: Skin is warm and dry.  Neurological:     Mental Status: She is alert and oriented to person, place, and time.  Psychiatric:        Mood and Affect: Mood normal.        Behavior: Behavior normal.        Thought Content: Thought content normal.        Judgment: Judgment normal.      Imaging: No results found.  Labs:  CBC: Recent Labs    11/08/18 1641 11/09/18 0500 11/10/18 0609  WBC 9.3 11.1* 10.1  HGB 15.1* 12.8 11.1*  HCT 46.0 40.0 34.6*  PLT 221 181 160    COAGS: Recent Labs    11/08/18 1641  INR 1.01  APTT 32    BMP: Recent Labs    11/08/18 1641 11/09/18 0500 11/10/18 0609 11/11/18 0757  NA 139 142 140 140  K 4.1 3.7 3.4* 3.6  CL 105 113* 113* 109  CO2 28 22 22 23   GLUCOSE 89 138* 90 97  BUN 15 7 8 6   CALCIUM 9.3 8.2* 7.7* 8.6*  CREATININE 0.64 0.57 0.50 0.49  GFRNONAA >60 >60  >60 >60  GFRAA >60 >60 >60 >60    LIVER FUNCTION TESTS: Recent Labs    11/08/18 1641  BILITOT 0.6  AST 19  ALT 15  ALKPHOS 53  PROT 7.4  ALBUMIN 4.8    TUMOR MARKERS: No results for input(s): AFPTM, CEA, CA199, CHROMGRNA in the last 8760 hours.  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 Estanislado Pandy who presents today for routine 4 week follow up.   Dr. Estanislado Pandy was present for consultation. Addressed patient's mother's concerns regarding incidence of FMD - discussed that it is extremely rare for this to occur in other areas of the body, discussed no need for closer follow up with PCP - specifically that she does not require her carotids to be auscultated at each visit unless the provider feels this is warranted, discussed that there was no abnormality found on echocardiogram from 11/09/18 and that the reasoning for this test was to determine if there was an abnormality in her heart that would predispose her to further strokes - specifically reassured her that the stroke the patient had did not do any damage to her heart and finally that they can expect Maalle's speech and processing speed to continue to improve over the next 6 months, likely returning very near to baseline if not at baseline.  Patient expressed interest in going back to work - per Dr. 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  Plan for follow-up: 1. CTA head/neck in 6 months with follow up after to discuss results with Dr. Estanislado Pandy and formulate plan moving forward 2. May return to work and normal activity - return to work note given to patient today. 3. Continue Brilinta 90 mg BID + ASA 81 mg QD 4. Maintain  BP, DM and HLD control. 5. Continue SLP as indicated 6. Call NIR with any  concerning symptoms -- reviewed symptoms indicating need to call 911 including but not limited to facial drooping, aphasia, weakness, tingling, numbness, gait abnormalities, etc. Family and patient stated understanding to this precaution.  All questions answered and concerns addressed. Patient conveys understanding and agrees with plan.  Thank you for this interesting consult.  I greatly enjoyed meeting [employer] and look forward to participating in their care.  A copy of this report was sent to the requesting provider on this date.  Electronically Signed: Joaquim Nam, PA-C 12/10/2018, 9:36 AM    I spent a total of  40 Minutes in face to face in clinical consultation, greater than 50% of which was counseling/coordinating care for stroke follow up.

## 2018-12-11 ENCOUNTER — Ambulatory Visit: Payer: BC Managed Care – PPO

## 2018-12-11 DIAGNOSIS — R41841 Cognitive communication deficit: Secondary | ICD-10-CM

## 2018-12-11 DIAGNOSIS — I69315 Cognitive social or emotional deficit following cerebral infarction: Secondary | ICD-10-CM | POA: Diagnosis not present

## 2018-12-11 NOTE — Telephone Encounter (Signed)
Manuela Schwartz from [facility] is calling back for an update for this pt. Please call her back directly at [phone]

## 2018-12-11 NOTE — Telephone Encounter (Signed)
Likely symptoms related to left MCA infarct therefore likely not related to Brilinta. She will need to contact vascular surgery if patient is wanting brilinta discontinued. I would recommend continuation at this time.

## 2018-12-11 NOTE — Telephone Encounter (Signed)
I spoke with Charlean Sanfilippo at Dr. Sabra Heck office. I stated per Janett Billow stroke NP pts symptoms is not related to the [place]. Its related to her stroke with the brain fog, thinking and speaking. Pt is to continue the medication as prescribed. I stated pt is currently in therapy for PT,OT and [facility]. Charlean Sanfilippo will tell pts pcp.

## 2018-12-11 NOTE — Telephone Encounter (Signed)
Left vm for Manuela Schwartz RN with [facility] to call back

## 2018-12-12 NOTE — Patient Instructions (Signed)
Use a notes app to record notes if you like.  Bring your laptop to work on some emails next time.

## 2018-12-12 NOTE — Therapy (Signed)
[facility] [address] [place], [place], [address] Phone: [phone]   Fax:  [phone]  Speech Language Pathology Treatment  Patient Details  Name: Vanessa Sharp MRN: [id number] Date of Birth: [birth year] Referring Provider (SLP): Vanessa Sharp   Encounter Date: 12/11/2018  End of Session - 12/12/18 0821    Visit Number  2    Number of Visits  13    Date for SLP Re-Evaluation  01/11/19    SLP Start Time  0808    SLP Stop Time   0848    SLP Time Calculation (min)  40 min    Activity Tolerance  Patient tolerated treatment well       No past medical history on file.  Past Surgical History:  Procedure Laterality Date  . ABDOMINAL HYSTERECTOMY  09/21/2017   cervical cancer  . IR ANGIO INTRA EXTRACRAN SEL COM CAROTID INNOMINATE UNI R MOD SED  11/09/2018  . IR INTRA CRAN STENT  11/09/2018  . IR INTRAVSC STENT CERV CAROTID W/O EMB-PROT MOD SED INC ANGIO  11/09/2018  . IR PERCUTANEOUS ART THROMBECTOMY/INFUSION INTRACRANIAL INC DIAG ANGIO  11/09/2018  . RADIOLOGY WITH ANESTHESIA N/A 11/08/2018   Procedure: IR WITH ANESTHESIA;  Surgeon: Vanessa Bras, MD;  Location: [facility];  Service: Radiology;  Laterality: N/A;    There were no vitals filed for this visit.         ADULT SLP TREATMENT - 12/12/18 0001      General Information   Behavior/Cognition  Alert;Cooperative;Pleasant mood      Treatment Provided   Treatment provided  Cognitive-Linquistic      Cognitive-Linquistic Treatment   Treatment focused on  Cognition    Skilled Treatment  SLP reviewed pt's derived goals with her. Pt stated goals were reasonable. SLP talked about compensatory measures for pt's memory. Pt is currently writing down more infomration than she did prior to her CVA. She reports more difficulty with auditory ino than prior to CVA. SLP played 2-4 minute news stories for pt and she took adequate notes. Pt was also educated  on other ways to compensate using voice notes on phone when pen/paper/notebook unavailable. Pt to bring in work lsptop to remote in to her email so SLP can work on [employer], as pt states "I just don't know how to get started." Pt could not be more specific.       Assessment / Recommendations / Plan   Plan  Continue with current plan of care      Progression Toward Goals   Progression toward goals  Progressing toward goals       SLP Education - 12/12/18 0820    Education Details  compensations for memory    Person(s) Educated  Patient;Spouse    Methods  Explanation    Comprehension  Verbalized understanding       SLP Short Term Goals - 12/12/18 0824      SLP SHORT TERM GOAL #1   Title  Pt will utilize compensatory strategies to attend to and recall moderately complex conversation over 10 minutes with rare min A, repeating salient details of conversation    Time  3    Period  Weeks    Status  On-going      SLP SHORT TERM GOAL #2   Title  Pt will respond to social or work related emails with compensatory strategies with no errors over 2 sessions    Time  3  Period  Weeks    Status  On-going       SLP Long Term Goals - 12/12/18 0824      SLP LONG TERM GOAL #1   Title  Pt will utilize internal and external strategies to comprehend complex conversation over 15 minutes repeating details with rare min A    Time  6    Period  Weeks    Status  On-going      SLP LONG TERM GOAL #2   Title  Pt will generate complex work related emails with no errors and min A from compensatory strategies over 3 sessions    Time  6    Period  Weeks    Status  On-going       Plan - 12/12/18 9024    Clinical Impression Statement  Pt seen today with cognitive changes s/p 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 Pt reports cont'd difficulty responding to emails. She was told to bring her laptop next session so ST can work with pt with this. Vanessa Sharp's primary complaint is being  able to follow conversations as quickly as she did prior to the CVA. The Cognitive Linguistic Quick Test (CLQT) was administered. Pt scored WNL on all subtests except memory, which was mildly impaired. Her greatest difficulty was on the Story Retell subtest, where she expressed frustration that "I didn't get any more of it"  I question if this is true memory vs auditory comprehension or processing/attention impairment. Although language was WNL, it was low WNL. Word finding difficulties were not appreciated during this evaluation and pt does not feel like she is having word finding difficulties. She works at [employer] in the [employer]. I recommend skilled ST to maximize cognition and carryover of compensations for cognitive linguistic impairment for successful return to work and QOL.     Speech Therapy Frequency  2x / week    Duration  --   6 weeks or 13 visits   Treatment/Interventions  Compensatory strategies;Cognitive reorganization;Environmental controls;Compensatory techniques;Cueing hierarchy;Internal/external aids;Functional tasks;SLP instruction and feedback;Patient/family education    Potential to Achieve Goals  Good    Consulted and Agree with Plan of Care  Patient;Family member/caregiver    Family Member Consulted  spouse, Vanessa Sharp       Patient will benefit from skilled therapeutic intervention in order to improve the following deficits and impairments:   Cognitive communication deficit    Problem List Patient Active Problem List   Diagnosis Date Noted  . 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 Acute ischemic stroke ([place]) 11/08/2018    [facility] ,[place], [place]  12/12/2018, 8:25 AM  [facility] [address] [place], [place], [address] Phone: [phone]   Fax:  [phone]   Name: Vanessa Sharp MRN: [id number] Date of Birth: 1976/01/30

## 2018-12-14 ENCOUNTER — Ambulatory Visit: Payer: BC Managed Care – PPO

## 2018-12-18 ENCOUNTER — Ambulatory Visit: Payer: BC Managed Care – PPO | Admitting: Speech Pathology

## 2018-12-20 ENCOUNTER — Ambulatory Visit: Payer: BC Managed Care – PPO

## 2018-12-25 ENCOUNTER — Encounter: Payer: Self-pay | Admitting: Adult Health
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
## 2018-12-26 ENCOUNTER — Ambulatory Visit (INDEPENDENT_AMBULATORY_CARE_PROVIDER_SITE_OTHER): Payer: BC Managed Care – PPO | Admitting: Adult Health

## 2018-12-26 ENCOUNTER — Encounter: Payer: Self-pay | Admitting: Adult Health

## 2018-12-26 ENCOUNTER — Other Ambulatory Visit: Payer: Self-pay

## 2018-12-26 DIAGNOSIS — E785 Hyperlipidemia, unspecified: Secondary | ICD-10-CM | POA: Diagnosis not present

## 2018-12-26 DIAGNOSIS — I63312 Cerebral infarction due to thrombosis of left middle cerebral artery: Secondary | ICD-10-CM | POA: Diagnosis not present

## 2018-12-26 DIAGNOSIS — R41841 Cognitive communication deficit: Secondary | ICD-10-CM | POA: Diagnosis not present

## 2018-12-26 DIAGNOSIS — I670 Dissection of cerebral arteries, nonruptured: Secondary | ICD-10-CM

## 2018-12-26 NOTE — Patient Instructions (Signed)
Continue aspirin 81 mg daily and Brilinta (ticagrelor) 90 mg bid  and Lipitor for secondary stroke prevention  Continue to follow up with PCP regarding cholesterol management   Follow-up with vascular surgery in approximately 6 months for repeat imaging and ongoing duration of Brilinta -we will continue to obtain Brilinta refills from Dr. Estanislado Pandy  Recommend ongoing mind exercises while you are able to participate in speech therapy -recommend to restart speech therapy once able  Continue to stay active and maintain a healthy diet  Maintain strict control of hypertension with blood pressure goal below 130/90, diabetes with hemoglobin A1c goal below 6.5% and cholesterol with LDL cholesterol (bad cholesterol) goal below 70 mg/dL. I also advised the patient to eat a healthy diet with plenty of whole grains, cereals, fruits and vegetables, exercise regularly and maintain ideal body weight.  Followup in the future with me in 6 months or call earlier if needed       Thank you for coming to see [place] at [facility]. I hope we have been able to provide you high quality care today.  You may receive a patient satisfaction survey over the next few weeks. We would appreciate your feedback and comments so that we may continue to improve ourselves and the health of our patients.

## 2018-12-26 NOTE — Progress Notes (Signed)
I agree with the above plan 

## 2018-12-27 ENCOUNTER — Telehealth: Payer: Self-pay

## 2018-12-27 NOTE — Telephone Encounter (Signed)
Patient was contacted today regarding temporary reduction of Outpatient Neuro Rehabilitation Services due to concerns for community transmission of COVID-19.  Patient identity was verified.  Assessed if patient needed to be seen in person by clinician (recent fall or acute injury that requires hands on assessment and advice, change in diet order, post-surgical, special cases, etc.).    Patient did not have an acute/special need that requires in person visit. Proceeded with phone call.  Therapist advised the patient to continue to perform his/her HEP and assured he/she had no unanswered questions or concerns at this time.  The patient expressed interest in being contacted for an E-Visit, virtual check in, or Telehealth visit to continue their plan of care, when those services become available.  Outpatient [facility] Services will follow up with patient at that time.  Patient is aware we can be reached by telephone during limited business hours in the meantime.   [place], [place]

## 2019-01-08 ENCOUNTER — Encounter: Payer: BC Managed Care – PPO | Admitting: Speech Pathology

## 2019-01-09 ENCOUNTER — Telehealth: Payer: Self-pay | Admitting: Occupational Therapy

## 2019-01-09 ENCOUNTER — Telehealth: Payer: Self-pay | Admitting: *Deleted

## 2019-01-09 NOTE — Telephone Encounter (Signed)
Called pt LVM unable to reach pt.

## 2019-01-09 NOTE — Telephone Encounter (Signed)
Patient was called regarding scheduling a telehealth visit. Therapist left a voicemail that if pt is interested to call the office to schedule. Theone Murdoch, OTR/L [phone] Phone: [phone] 4:33 PM 01/09/19

## 2019-01-10 ENCOUNTER — Encounter: Payer: BC Managed Care – PPO | Admitting: Speech Pathology

## 2019-01-15 ENCOUNTER — Ambulatory Visit: Payer: BC Managed Care – PPO | Admitting: Speech Pathology

## 2019-01-17 ENCOUNTER — Encounter: Payer: BC Managed Care – PPO | Admitting: Speech Pathology

## 2019-02-08 ENCOUNTER — Other Ambulatory Visit: Payer: Self-pay

## 2019-02-08 NOTE — Patient Outreach (Signed)
First attempt to obtain mRs. No answer. Left message for return call.  

## 2019-02-12 ENCOUNTER — Encounter: Payer: Self-pay | Admitting: Adult Health

## 2019-02-15 ENCOUNTER — Other Ambulatory Visit: Payer: Self-pay

## 2019-02-15 NOTE — Patient Outreach (Signed)
Second attempt to obtain mRs. No answer. Left message for return call.  

## 2019-02-20 ENCOUNTER — Other Ambulatory Visit: Payer: Self-pay

## 2019-02-20 NOTE — Patient Outreach (Signed)
3 outreach attempts were completed to obtain mRs. mRs could not be obtained because patient never returned my calls. mRs=7 

## 2019-05-27 ENCOUNTER — Other Ambulatory Visit (HOSPITAL_COMMUNITY): Payer: Self-pay | Admitting: Interventional Radiology

## 2019-05-27 DIAGNOSIS — I639 Cerebral infarction, unspecified: Secondary | ICD-10-CM

## 2019-06-12 ENCOUNTER — Other Ambulatory Visit: Payer: Self-pay

## 2019-06-12 ENCOUNTER — Ambulatory Visit (HOSPITAL_COMMUNITY)
Admission: RE | Admit: 2019-06-12 | Discharge: 2019-06-12 | Disposition: A | Discharge status: Home / Self Care | Payer: BC Managed Care – PPO | Source: Ambulatory Visit | Attending: Interventional Radiology | Admitting: Interventional Radiology

## 2019-06-12 DIAGNOSIS — I639 Cerebral infarction, unspecified: Secondary | ICD-10-CM | POA: Diagnosis present

## 2019-06-12 MED ORDER — IOHEXOL 350 MG/ML SOLN
80.0000 mL | Freq: Once | INTRAVENOUS | Status: AC | PRN
  Administered 2019-06-12: 80 mL via INTRAVENOUS

## 2019-06-17 ENCOUNTER — Telehealth (HOSPITAL_COMMUNITY): Payer: Self-pay

## 2019-06-17 NOTE — Telephone Encounter (Signed)
Pt agreed to f/u in 6 months with cta head/neck. Wants to know if pt can d/c Brilinta. Sent message to PA. Aw

## 2019-06-17 NOTE — Telephone Encounter (Signed)
f/u in 6 months with cta head/neck. Will discuss stopping Brilinta after next f/u per Dr. Estanislado Pandy. Husband agreed with plan. AW

## 2019-10-09 ENCOUNTER — Other Ambulatory Visit: Payer: BC Managed Care – PPO

## 2019-10-10 ENCOUNTER — Ambulatory Visit: Payer: BC Managed Care – PPO | Attending: Internal Medicine

## 2019-10-10 DIAGNOSIS — Z20822 Contact with and (suspected) exposure to covid-19: Secondary | ICD-10-CM

## 2019-10-11 LAB — NOVEL CORONAVIRUS, NAA: SARS-CoV-2, NAA: NOT DETECTED

## 2019-10-29 ENCOUNTER — Other Ambulatory Visit: Payer: Self-pay | Admitting: Family Medicine

## 2019-10-29 DIAGNOSIS — Z1231 Encounter for screening mammogram for malignant neoplasm of breast: Secondary | ICD-10-CM

## 2019-10-30 ENCOUNTER — Other Ambulatory Visit: Payer: Self-pay

## 2019-10-30 ENCOUNTER — Ambulatory Visit
Admission: RE | Admit: 2019-10-30 | Discharge: 2019-10-30 | Disposition: A | Discharge status: Home / Self Care | Payer: BC Managed Care – PPO | Source: Ambulatory Visit

## 2019-10-30 DIAGNOSIS — Z1231 Encounter for screening mammogram for malignant neoplasm of breast: Secondary | ICD-10-CM

## 2019-12-02 ENCOUNTER — Ambulatory Visit: Payer: BC Managed Care – PPO

## 2020-01-16 ENCOUNTER — Telehealth: Payer: Self-pay | Admitting: Student

## 2020-01-16 NOTE — Telephone Encounter (Signed)
PA notified of patient with questions re: care plan following endovascular reconstruction of a long segment dissection of the L ICA using a pipeline flow diverter 11/12/18.   PA spoke with patient and her husband. She was recently seen by Neuroradiology at [facility].  They ask if it is necessary to continue her Brilinta and statins at this time.  She was last contacted by our department following her scans in September 2020 at which time 6 month follow-up was recommended. This has not yet been scheduled.   Discussed with Dr. Estanislado Pandy. His recommendation is that the patient continue her medications for now.  Plan for repeat imaging within the next week if able, and schedule a follow-up visit to discuss all the above concerns as well as for routine post-treatment management.   Patient informed of Dr. Arlean Hopping recommendations.  She is expecting our call to arrange as outlined.   Brynda Greathouse, MS RD PA-C

## 2020-01-17 ENCOUNTER — Other Ambulatory Visit (HOSPITAL_COMMUNITY): Payer: Self-pay | Admitting: Interventional Radiology

## 2020-01-17 ENCOUNTER — Telehealth (HOSPITAL_COMMUNITY): Payer: Self-pay | Admitting: Radiology

## 2020-01-17 DIAGNOSIS — I639 Cerebral infarction, unspecified: Secondary | ICD-10-CM

## 2020-01-17 NOTE — Telephone Encounter (Signed)
Called pt, left VM for her to call to schedule her CTA head/neck f/u JM

## 2020-01-23 ENCOUNTER — Ambulatory Visit (HOSPITAL_COMMUNITY)
Admission: RE | Admit: 2020-01-23 | Discharge: 2020-01-23 | Disposition: A | Discharge status: Home / Self Care | Payer: BC Managed Care – PPO | Source: Ambulatory Visit | Attending: Interventional Radiology | Admitting: Interventional Radiology

## 2020-01-23 ENCOUNTER — Other Ambulatory Visit: Payer: Self-pay

## 2020-01-23 DIAGNOSIS — I639 Cerebral infarction, unspecified: Secondary | ICD-10-CM | POA: Diagnosis present

## 2020-01-23 MED ORDER — IOHEXOL 350 MG/ML SOLN
75.0000 mL | Freq: Once | INTRAVENOUS | Status: AC | PRN
  Administered 2020-01-23: 75 mL via INTRAVENOUS

## 2020-01-24 ENCOUNTER — Telehealth (HOSPITAL_COMMUNITY): Payer: Self-pay | Admitting: Radiology

## 2020-01-24 NOTE — Telephone Encounter (Signed)
Called pt, left VM that her next f/u would be due in 6 months time and that per Deveshwar her CTA head/neck was stable. Told her to call if she has any questions or concerns. JM

## 2020-01-27 ENCOUNTER — Telehealth: Payer: Self-pay | Admitting: Student

## 2020-01-27 NOTE — Telephone Encounter (Signed)
NIR.  Received message from patient requesting call back regarding discontinuation of Brilinta. Discussed with Dr. Estanislado Pandy who recommends discontinuing Brilinta/Aspirin 81 mg use, begin taking Aspirin 325 mg once daily, and follow-up with image-guided diagnostic cerebral arteriogram in 6 months to evaluate stent. Called patient at [phone] to discuss above. All questions answered and concerns addressed. Patient conveys understanding and agrees with plan.   Bea Graff Kaylany Tesoriero, PA-C 01/27/2020, 10:23 AM

## 2020-02-10 ENCOUNTER — Ambulatory Visit: Payer: BC Managed Care – PPO | Admitting: Adult Health

## 2020-03-25 IMAGING — CT CT HEAD CODE STROKE
3 series · 15 of 44 positions shown, 18 images · non-contrast
Comparison: None.

CLINICAL DATA: Code stroke.  Aphasia.

EXAM:
CT HEAD WITHOUT CONTRAST
TECHNIQUE: Contiguous axial images were obtained from the base of the skull
through the vertex without intravenous contrast.

[Series 3: head wo · axial · 0.40mm/px · z∈[+464,+574]mm · 9 of 27 slices shown, 12 images]
[im 3/27  brain]
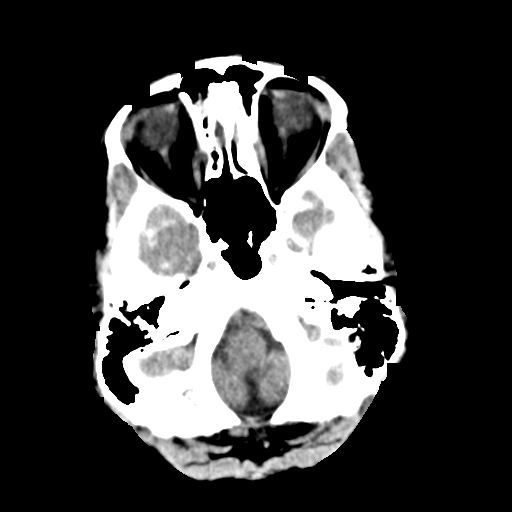
[im 3/27  bone]
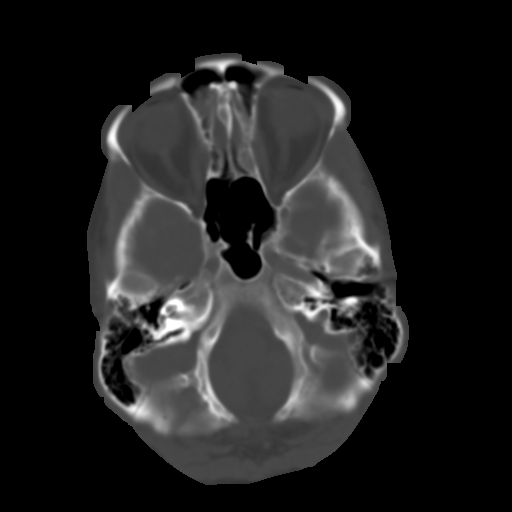
[im 6/27  brain]
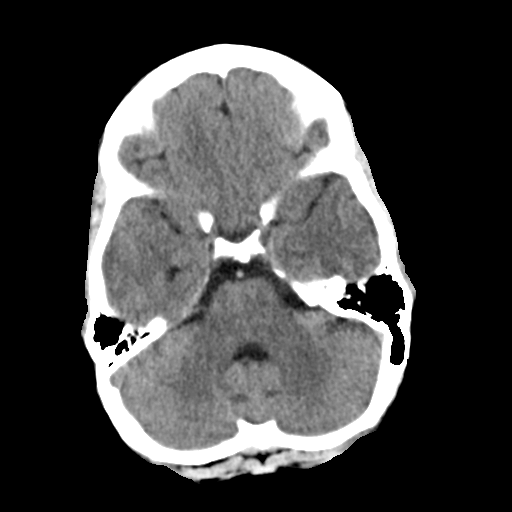
[im 8/27  brain]
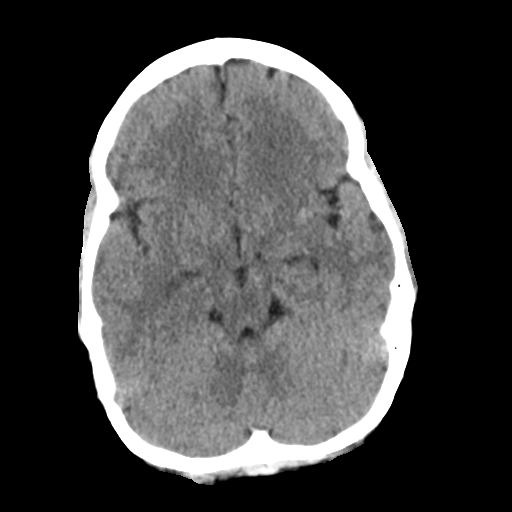
[im 11/27  brain]
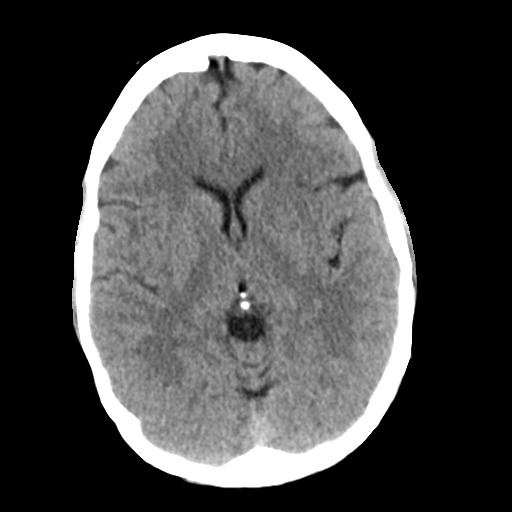
[im 14/27  brain]
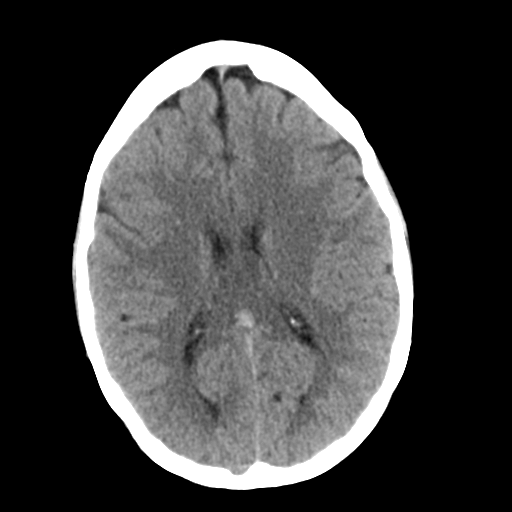
[im 14/27  bone]
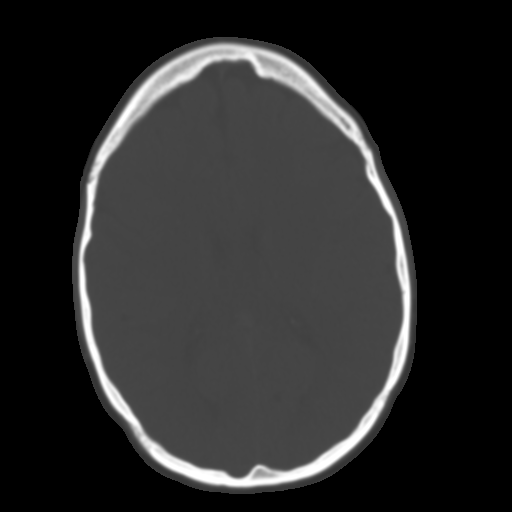
[im 17/27  brain]
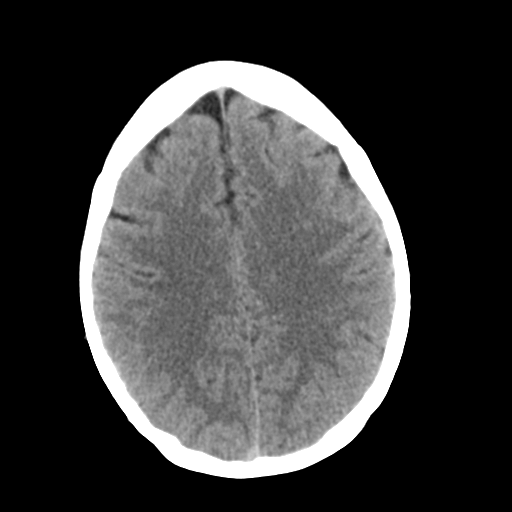
[im 20/27  brain]
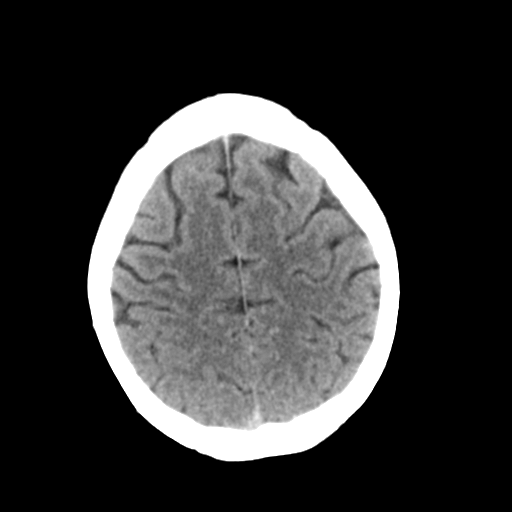
[im 22/27  brain]
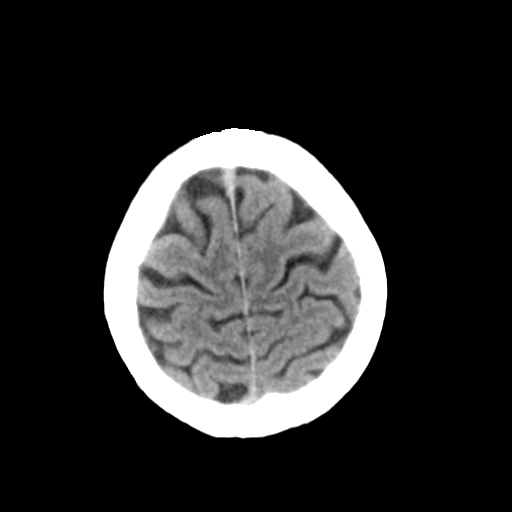
[im 25/27  brain]
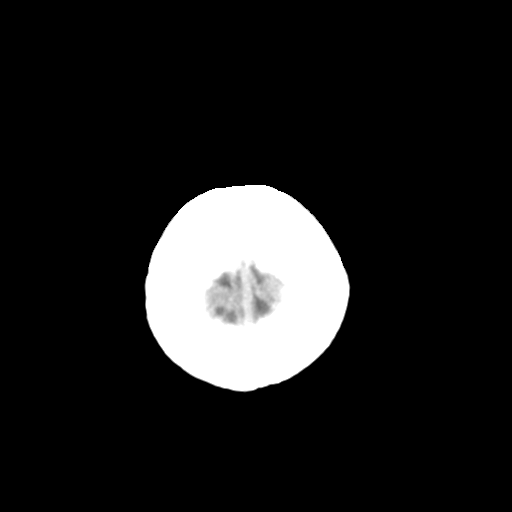
[im 25/27  bone]
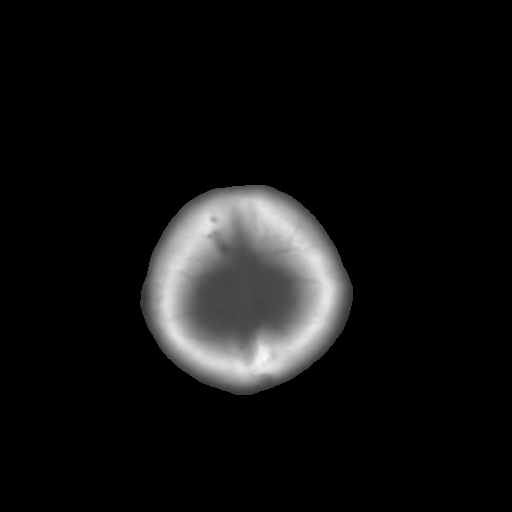

[Series 4: coronal soft tissue · coronal · 0.30mm/px · 3 of 66 slices shown]
[im 22/66  brain]
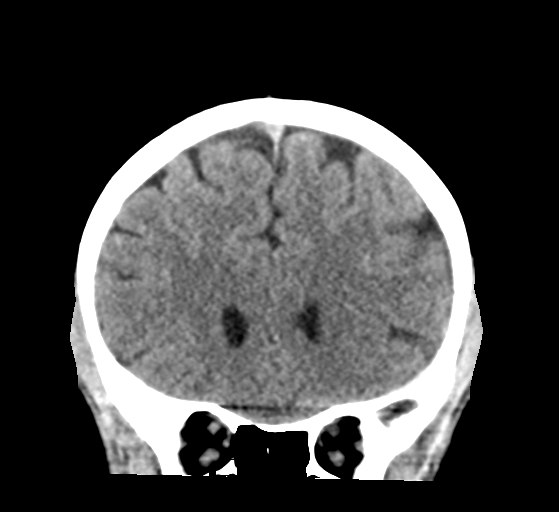
[im 29/66  brain]
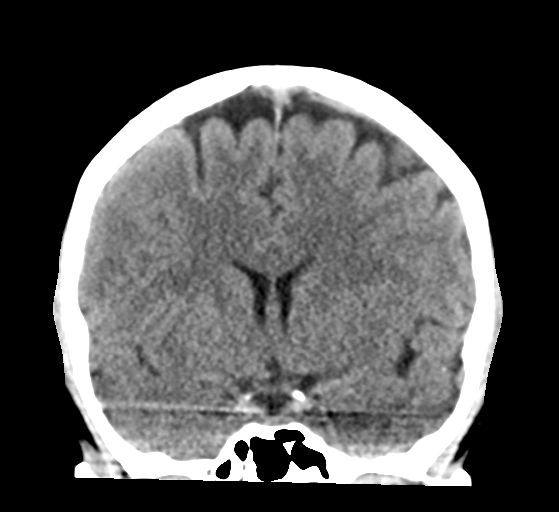
[im 37/66  brain]
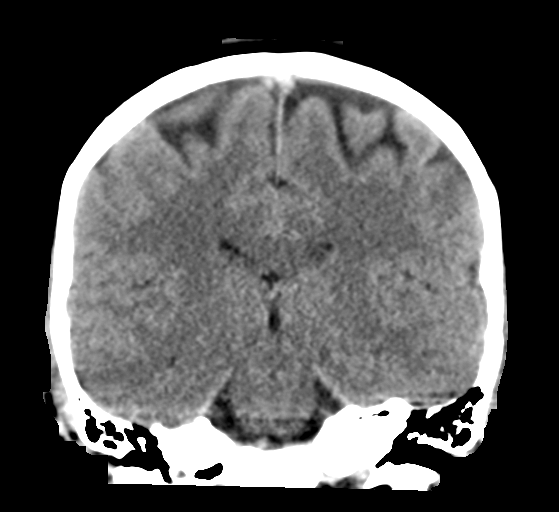

[Series 5: sagittal soft tissue · sagittal · 0.30mm/px · 3 of 56 slices shown]
[im 19/56  brain]
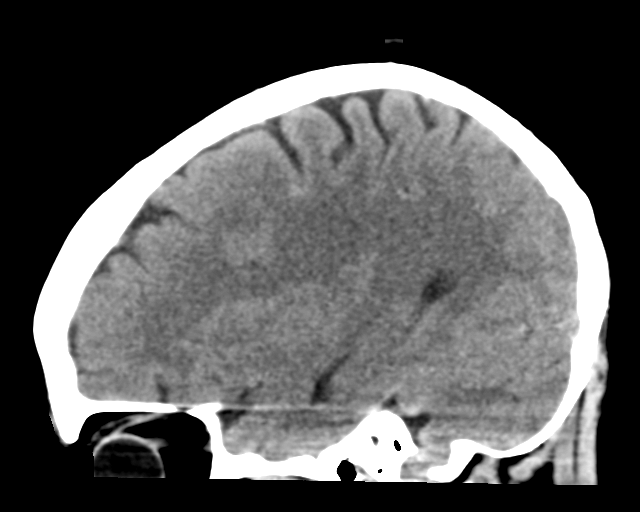
[im 28/56  brain]
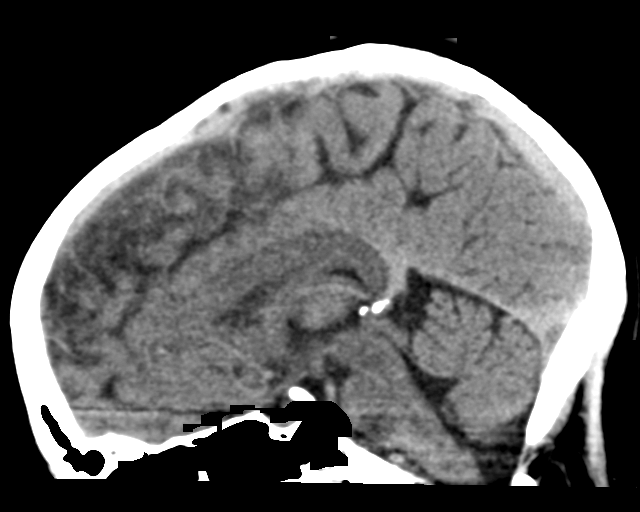
[im 37/56  brain]
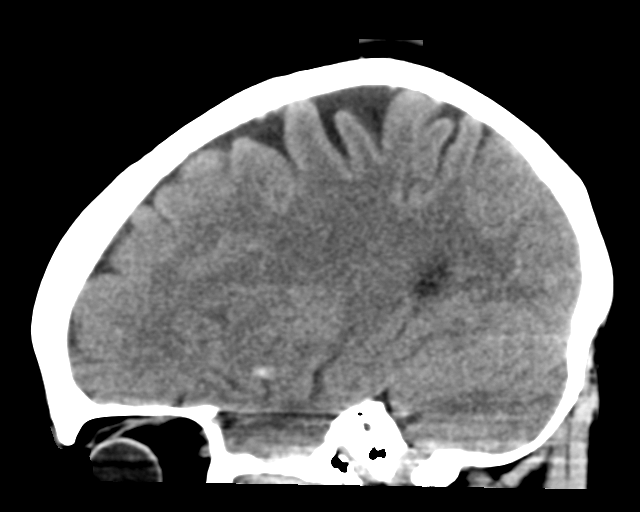

[15 of 44 positions shown; findings below may reference images not displayed]

FINDINGS: Brain: There is loss of gray-white differentiation consistent with
acute infarction in the left MCA territory involving the insula,
caudate head, lentiform nucleus, and frontal operculum. No
intracranial hemorrhage, midline shift, or extra-axial fluid
collection is identified. The ventricles and sulci are normal.

Vascular: Hyperdense proximal left MCA.

Skull: No fracture or focal osseous lesion.

Sinuses/Orbits: Partially visualized left ethmoid air cell
opacification. Clear mastoid air cells. Unremarkable included
orbits.

Other: None.

ASPECTS (Alberta Stroke Program Early CT Score)

- Ganglionic level infarction (caudate, lentiform nuclei, internal
capsule, insula, M1-M3 cortex): 3

- Supraganglionic infarction (M4-M6 cortex): 3

Total score (0-10 with 10 being normal): 6
IMPRESSION: 1. Acute nonhemorrhagic left MCA infarct with hyperdense proximal
MCA.
2. ASPECTS is 6.

These results were called by telephone at the time of interpretation
on 11/08/2018 at [DATE] to Dr. Per, who verbally acknowledged
these results.

## 2020-07-16 ENCOUNTER — Telehealth (HOSPITAL_COMMUNITY): Payer: Self-pay | Admitting: Radiology

## 2020-07-16 ENCOUNTER — Other Ambulatory Visit (HOSPITAL_COMMUNITY): Payer: Self-pay | Admitting: Interventional Radiology

## 2020-07-16 DIAGNOSIS — I639 Cerebral infarction, unspecified: Secondary | ICD-10-CM

## 2020-07-16 NOTE — Telephone Encounter (Signed)
Called pt to schedule stroke f/u angiogram with Deveshwar. Pt states she is going to [facility] for a second opinion today and is likely moving her care there. She will call back if she changes her mind. JM

## 2020-07-17 ENCOUNTER — Telehealth (HOSPITAL_COMMUNITY): Payer: Self-pay | Admitting: Radiology

## 2020-07-17 NOTE — Telephone Encounter (Signed)
Returned call to Vanessa Sharp. Per Radonna Ricker the reason cerebral angio is preferred for follow-up is that Vanessa Sharp had a stent placed and this is the most accurate way to see inside the stent. Vanessa Sharp to call if she decides she wants to have the angiogram with Deveshwar. JM

## 2020-08-07 ENCOUNTER — Other Ambulatory Visit (HOSPITAL_COMMUNITY): Payer: Self-pay | Admitting: Interventional Radiology

## 2020-08-07 ENCOUNTER — Telehealth (HOSPITAL_COMMUNITY): Payer: Self-pay | Admitting: Radiology

## 2020-08-07 DIAGNOSIS — I639 Cerebral infarction, unspecified: Secondary | ICD-10-CM

## 2020-08-07 NOTE — Telephone Encounter (Signed)
Called pt to schedule her cerebral angiogram with Deveshwar. Left VM for her to call back to schedule. JM

## 2020-09-07 ENCOUNTER — Other Ambulatory Visit: Payer: Self-pay | Admitting: Radiology

## 2020-09-08 ENCOUNTER — Other Ambulatory Visit: Payer: Self-pay

## 2020-09-08 ENCOUNTER — Ambulatory Visit (HOSPITAL_COMMUNITY)
Admission: RE | Admit: 2020-09-08 | Discharge: 2020-09-08 | Disposition: A | Discharge status: Home / Self Care | Payer: BC Managed Care – PPO | Source: Ambulatory Visit | Attending: Interventional Radiology | Admitting: Interventional Radiology

## 2020-09-08 ENCOUNTER — Encounter (HOSPITAL_COMMUNITY): Payer: Self-pay

## 2020-09-08 DIAGNOSIS — I7771 Dissection of carotid artery: Secondary | ICD-10-CM | POA: Diagnosis present

## 2020-09-08 DIAGNOSIS — Z7982 Long term (current) use of aspirin: Secondary | ICD-10-CM | POA: Diagnosis not present

## 2020-09-08 DIAGNOSIS — Z885 Allergy status to narcotic agent status: Secondary | ICD-10-CM | POA: Insufficient documentation

## 2020-09-08 DIAGNOSIS — I639 Cerebral infarction, unspecified: Secondary | ICD-10-CM

## 2020-09-08 HISTORY — PX: IR ANGIO VERTEBRAL SEL VERTEBRAL BILAT MOD SED: IMG5369

## 2020-09-08 HISTORY — PX: IR ANGIO INTRA EXTRACRAN SEL COM CAROTID INNOMINATE BILAT MOD SED: IMG5360

## 2020-09-08 LAB — CBC
HCT: 47 % — ABNORMAL HIGH (ref 36.0–46.0)
Hemoglobin: 15.2 g/dL — ABNORMAL HIGH (ref 12.0–15.0)
MCH: 29.1 pg (ref 26.0–34.0)
MCHC: 32.3 g/dL (ref 30.0–36.0)
MCV: 90 fL (ref 80.0–100.0)
Platelets: 224 10*3/uL (ref 150–400)
RBC: 5.22 MIL/uL — ABNORMAL HIGH (ref 3.87–5.11)
RDW: 12.9 % (ref 11.5–15.5)
WBC: 8.9 10*3/uL (ref 4.0–10.5)
nRBC: 0 % (ref 0.0–0.2)

## 2020-09-08 LAB — BASIC METABOLIC PANEL
Anion gap: 15 (ref 5–15)
BUN: 11 mg/dL (ref 6–20)
CO2: 22 mmol/L (ref 22–32)
Calcium: 9.2 mg/dL (ref 8.9–10.3)
Chloride: 103 mmol/L (ref 98–111)
Creatinine, Ser: 0.63 mg/dL (ref 0.44–1.00)
GFR, Estimated: >60 mL/min (ref >60)
Glucose, Bld: 87 mg/dL (ref 70–99)
Potassium: 3.9 mmol/L (ref 3.5–5.1)
Sodium: 140 mmol/L (ref 135–145)

## 2020-09-08 LAB — PROTIME-INR
INR: 1 (ref 0.8–1.2)
Prothrombin Time: 13.1 seconds (ref 11.4–15.2)

## 2020-09-08 LAB — APTT: aPTT: 29 seconds (ref 24–36)

## 2020-09-08 MED ORDER — IOHEXOL 300 MG/ML  SOLN
150.0000 mL | Freq: Once | INTRAMUSCULAR | Status: AC | PRN
  Administered 2020-09-08: 13:00:00 75 mL

## 2020-09-08 MED ORDER — HEPARIN SODIUM (PORCINE) 1000 UNIT/ML IJ SOLN
INTRAMUSCULAR | Status: DC | PRN
  Administered 2020-09-08: 1000 [IU] via INTRAVENOUS

## 2020-09-08 MED ORDER — LIDOCAINE HCL 1 % IJ SOLN
INTRAMUSCULAR | Status: AC
  Filled 2020-09-08: qty 20

## 2020-09-08 MED ORDER — MIDAZOLAM HCL 2 MG/2ML IJ SOLN
INTRAMUSCULAR | Status: AC
  Filled 2020-09-08: qty 2

## 2020-09-08 MED ORDER — SODIUM CHLORIDE 0.9 % IV SOLN: INTRAVENOUS | Status: AC

## 2020-09-08 MED ORDER — HEPARIN SODIUM (PORCINE) 1000 UNIT/ML IJ SOLN
INTRAMUSCULAR | Status: AC
  Filled 2020-09-08: qty 1

## 2020-09-08 MED ORDER — LIDOCAINE HCL (PF) 1 % IJ SOLN
INTRAMUSCULAR | Status: DC | PRN
  Administered 2020-09-08: 5 mL

## 2020-09-08 MED ORDER — FENTANYL CITRATE (PF) 100 MCG/2ML IJ SOLN
INTRAMUSCULAR | Status: DC | PRN
  Administered 2020-09-08: 25 ug via INTRAVENOUS
  Administered 2020-09-08: 12.5 ug via INTRAVENOUS

## 2020-09-08 MED ORDER — MIDAZOLAM HCL 2 MG/2ML IJ SOLN
INTRAMUSCULAR | Status: DC | PRN
  Administered 2020-09-08: 1 mg via INTRAVENOUS

## 2020-09-08 MED ORDER — SODIUM CHLORIDE 0.9 % IV SOLN: INTRAVENOUS | Status: DC

## 2020-09-08 MED ORDER — FENTANYL CITRATE (PF) 100 MCG/2ML IJ SOLN
INTRAMUSCULAR | Status: AC
  Filled 2020-09-08: qty 2

## 2020-09-08 NOTE — H&P (Addendum)
Chief Complaint: Patient was seen in consultation today for cerebral arteriogram  Supervising Physician: Luanne Bras  Patient Status: [facility] - Out-pt  History of Present Illness: Vanessa Sharp is a 44 y.o. female   Pt suffered CVA 10/2018 L MCA revascularization with L ICA dissection pipeline stent placement 11/08/18 with Dr Estanislado Pandy  Follow up imaging of CTA every 6-8 months Most recent CTA  01/23/20: IMPRESSION: No acute intracranial abnormality.  Chronic left MCA infarct. Overlapping left ICA stents remain patent.  Pt is doing well Denies headaches; denies N/V Denies dizziness No vision or speech changes Using all 4s  Scheduled today for cerebral arteriogram per Dr Estanislado Pandy She has not taken Brilinta in "months" Follows with Dr Cordie Grice at [facility]  History reviewed. No pertinent past medical history.  Past Surgical History:  Procedure Laterality Date  . ABDOMINAL HYSTERECTOMY  09/21/2017   cervical cancer  . IR ANGIO INTRA EXTRACRAN SEL COM CAROTID INNOMINATE UNI R MOD SED  11/09/2018  . IR INTRA CRAN STENT  11/09/2018  . IR INTRAVSC STENT CERV CAROTID W/O EMB-PROT MOD SED INC ANGIO  11/09/2018  . IR PERCUTANEOUS ART THROMBECTOMY/INFUSION INTRACRANIAL INC DIAG ANGIO  11/09/2018  . RADIOLOGY WITH ANESTHESIA N/A 11/08/2018   Procedure: IR WITH ANESTHESIA;  Surgeon: Luanne Bras, MD;  Location: [facility];  Service: Radiology;  Laterality: N/A;    Allergies: Hydrocodone and Pseudoephedrine hcl  Medications: Prior to Admission medications   Medication Sig Start Date End Date Taking? Authorizing Provider  acetaminophen (TYLENOL) 325 MG tablet Take 2 tablets (650 mg total) by mouth every 6 (six) hours as needed for mild pain, moderate pain, fever or headache. Patient taking differently: Take 325 mg by mouth every 6 (six) hours as needed for mild pain, moderate pain, fever or headache. 11/11/18  Yes Rinehuls, Early Chars, PA-C  aspirin EC 325  MG tablet Take 325 mg by mouth daily.   Yes [provider]  Cholecalciferol (DIALYVITE VITAMIN D 5000) 125 MCG (5000 UT) capsule Take 5,000 Units by mouth daily.   Yes [provider]  atorvastatin (LIPITOR) 40 MG tablet Take 1 tablet (40 mg total) by mouth daily at 6 PM. Patient not taking: No sig reported 11/11/18   Rinehuls, Early Chars, PA-C  Melatonin 10 MG CAPS Take 10 mg by mouth at bedtime as needed (sleep).    [provider]  ticagrelor (BRILINTA) 90 MG TABS tablet Take 1 tablet (90 mg total) by mouth 2 (two) times daily. Patient not taking: No sig reported 11/11/18   Rinehuls, Early Chars, PA-C     Family History  Problem Relation Age of Onset  . Breast cancer Mother     Social History   Socioeconomic History  . Marital status: Married    Spouse name: Not on file  . Number of children: Not on file  . Years of education: Not on file  . Highest education level: Not on file  Occupational History  . Not on file  Tobacco Use  . Smoking status: Not on file  . Smokeless tobacco: Not on file  Substance and Sexual Activity  . Alcohol use: Not on file  . Drug use: Not on file  . Sexual activity: Not on file  Other Topics Concern  . Not on file  Social History Narrative  . Not on file   Social Determinants of Health   Financial Resource Strain: Not on file  Food Insecurity: Not on file  Transportation Needs: Not  on file  Physical Activity: Not on file  Stress: Not on file  Social Connections: Not on file    Review of Systems: A 12 point ROS discussed and pertinent positives are indicated in the HPI above.  All other systems are negative.  Review of Systems  Constitutional: Negative for activity change, fatigue and fever.  HENT: Negative for tinnitus, trouble swallowing and voice change.   Eyes: Negative for visual disturbance.  Respiratory: Negative for cough and shortness of breath.   Cardiovascular: Negative for chest pain.  Gastrointestinal:  Negative for abdominal pain.  Musculoskeletal: Negative for back pain and gait problem.  Neurological: Negative for dizziness, tremors, seizures, syncope, facial asymmetry, speech difficulty, weakness, light-headedness, numbness and headaches.  Psychiatric/Behavioral: Negative for behavioral problems, confusion and decreased concentration.    Vital Signs: BP 131/76   Pulse (!) 106   Temp 98.8 F (37.1 C) (Oral)   Ht 5\' 7"  (1.702 m)   Wt 180 lb (81.6 kg)   LMP 05/15/2017   SpO2 100%   BMI 28.19 kg/m   Physical Exam Vitals reviewed.  HENT:     Mouth/Throat:     Mouth: Mucous membranes are moist.  Eyes:     Extraocular Movements: Extraocular movements intact.  Cardiovascular:     Rate and Rhythm: Normal rate and regular rhythm.     Heart sounds: Normal heart sounds.  Pulmonary:     Effort: Pulmonary effort is normal.     Breath sounds: Normal breath sounds.  Abdominal:     Palpations: Abdomen is soft.     Tenderness: There is no abdominal tenderness.  Musculoskeletal:        General: Normal range of motion.     Right lower leg: No edema.     Left lower leg: No edema.  Skin:    General: Skin is warm.  Neurological:     Mental Status: She is alert and oriented to person, place, and time.  Psychiatric:        Behavior: Behavior normal.        Judgment: Judgment normal.     Imaging: No results found.  Labs:  CBC: No results for input(s): WBC, HGB, HCT, PLT in the last 8760 hours.  COAGS: No results for input(s): INR, APTT in the last 8760 hours.  BMP: No results for input(s): NA, K, CL, CO2, GLUCOSE, BUN, CALCIUM, CREATININE, GFRNONAA, GFRAA in the last 8760 hours.  Invalid input(s): CMP  LIVER FUNCTION TESTS: No results for input(s): BILITOT, AST, ALT, ALKPHOS, PROT, ALBUMIN in the last 8760 hours.  TUMOR MARKERS: No results for input(s): AFPTM, CEA, CA199, CHROMGRNA in the last 8760 hours.  Assessment and Plan:  Hx CVA 10/2018 L MCA revasularization  (TICI3) and Ll ICA dissection pipeline stent placed in IR 11/08/18. Surveillance CTA every 6-8 months without changes Follow up now with cerebral arteriogram per Dr Estanislado Pandy  Pt is asymptomatic Risks and benefits of cerebral angiogram with intervention were discussed with the patient including, but not limited to bleeding, infection, vascular injury, contrast induced renal failure, stroke or even death.  This interventional procedure involves the use of X-rays and because of the nature of the planned procedure, it is possible that we will have prolonged use of X-ray fluoroscopy.  Potential radiation risks to you include (but are not limited to) the following: - A slightly elevated risk for cancer  several years later in life. This risk is typically less than 0.5% percent. This risk is low in comparison  to the normal incidence of human cancer, which is 33% for women and 50% for men according to the [place]. - Radiation induced injury can include skin redness, resembling a rash, tissue breakdown / ulcers and hair loss (which can be temporary or permanent).   The likelihood of either of these occurring depends on the difficulty of the procedure and whether you are sensitive to radiation due to previous procedures, disease, or genetic conditions.   IF your procedure requires a prolonged use of radiation, you will be notified and given written instructions for further action.  It is your responsibility to monitor the irradiated area for the 2 weeks following the procedure and to notify your physician if you are concerned that you have suffered a radiation induced injury.    All of the patient's questions were answered, patient is agreeable to proceed.  Consent signed and in chart.  Thank you for this interesting consult.  I greatly enjoyed meeting [employer] and look forward to participating in their care.  A copy of this report was sent to the requesting provider on  this date.  Electronically Signed: Lavonia Drafts, PA-C 09/08/2020, 10:36 AM   I spent a total of  30 Minutes   in face to face in clinical consultation, greater than 50% of which was counseling/coordinating care for cerebral arteriogram

## 2020-09-08 NOTE — Sedation Documentation (Addendum)
Pressure released at 1330, groin site level 0, pulses palpable. Quick clot at groin site (to be removed at 24 hours)

## 2020-09-08 NOTE — Sedation Documentation (Signed)
Attempted to call report to short stay. 

## 2020-09-08 NOTE — Procedures (Signed)
S/P 4 vessel cerebral artreriogram RT CFA approach. Findings. 1.Completely healed reconstructed Lt ICA with no evidence of  construct stenosis. S.Talya Quain Md

## 2020-09-08 NOTE — Discharge Instructions (Signed)
Femoral Site Care This sheet gives you information about how to care for yourself after your procedure. Your health care provider may also give you more specific instructions. If you have problems or questions, contact your health care provider. What can I expect after the procedure? After the procedure, it is common to have:  Bruising that usually fades within 1-2 weeks.  Tenderness at the site. Follow these instructions at home: Wound care  Follow instructions from your health care provider about how to take care of your insertion site. Make sure you: ? Wash your hands with soap and water before you change your bandage (dressing). If soap and water are not available, use hand sanitizer. ? Change your dressing as told by your health care provider. ? Leave stitches (sutures), skin glue, or adhesive strips in place. These skin closures may need to stay in place for 2 weeks or longer. If adhesive strip edges start to loosen and curl up, you may trim the loose edges. Do not remove adhesive strips completely unless your health care provider tells you to do that.  Do not take baths, swim, or use a hot tub until your health care provider approves.  You may shower 24-48 hours after the procedure or as told by your health care provider. ? Gently wash the site with plain soap and water. ? Pat the area dry with a clean towel. ? Do not rub the site. This may cause bleeding.  Do not apply powder or lotion to the site. Keep the site clean and dry.  Check your femoral site every day for signs of infection. Check for: ? Redness, swelling, or pain. ? Fluid or blood. ? Warmth. ? Pus or a bad smell. Activity  For the first 2-3 days after your procedure, or as long as directed: ? Avoid climbing stairs as much as possible. ? Do not squat.  Do not lift anything that is heavier than 10 lb (4.5 kg), or the limit that you are told, until your health care provider says that it is safe.  Rest as  directed. ? Avoid sitting for a long time without moving. Get up to take short walks every 1-2 hours.  Do not drive for 24 hours if you were given a medicine to help you relax (sedative). General instructions  Take over-the-counter and prescription medicines only as told by your health care provider.  Keep all follow-up visits as told by your health care provider. This is important. Contact a health care provider if you have:  A fever or chills.  You have redness, swelling, or pain around your insertion site. Get help right away if:  The catheter insertion area swells very fast.  You pass out.  You suddenly start to sweat or your skin gets clammy.  The catheter insertion area is bleeding, and the bleeding does not stop when you hold steady pressure on the area.  The area near or just beyond the catheter insertion site becomes pale, cool, tingly, or numb. These symptoms may represent a serious problem that is an emergency. Do not wait to see if the symptoms will go away. Get medical help right away. Call your local emergency services (911 in the [place] Do not drive yourself to the hospital. Summary  After the procedure, it is common to have bruising that usually fades within 1-2 weeks.  Check your femoral site every day for signs of infection.  Do not lift anything that is heavier than 10 lb (4.5 kg), or the   limit that you are told, until your health care provider says that it is safe. This information is not intended to replace advice given to you by your health care provider. Make sure you discuss any questions you have with your health care provider. Document Revised: 09/25/2017 Document Reviewed: 09/25/2017 Elsevier Patient Education  2020 Elsevier Inc.  

## 2020-09-09 ENCOUNTER — Other Ambulatory Visit (HOSPITAL_COMMUNITY): Payer: Self-pay | Admitting: Interventional Radiology

## 2020-09-09 DIAGNOSIS — I639 Cerebral infarction, unspecified: Secondary | ICD-10-CM

## 2020-10-14 ENCOUNTER — Encounter: Payer: BC Managed Care – PPO | Admitting: Dermatology

## 2020-11-12 ENCOUNTER — Other Ambulatory Visit: Payer: Self-pay | Admitting: Family Medicine

## 2020-11-12 DIAGNOSIS — Z1231 Encounter for screening mammogram for malignant neoplasm of breast: Secondary | ICD-10-CM

## 2020-11-14 ENCOUNTER — Ambulatory Visit
Admission: RE | Admit: 2020-11-14 | Discharge: 2020-11-14 | Disposition: A | Discharge status: Home / Self Care | Payer: BC Managed Care – PPO | Source: Ambulatory Visit | Attending: Family Medicine | Admitting: Family Medicine

## 2020-11-14 DIAGNOSIS — Z1231 Encounter for screening mammogram for malignant neoplasm of breast: Secondary | ICD-10-CM

## 2021-09-15 ENCOUNTER — Other Ambulatory Visit (HOSPITAL_COMMUNITY): Payer: Self-pay | Admitting: Interventional Radiology

## 2021-09-15 DIAGNOSIS — I639 Cerebral infarction, unspecified: Secondary | ICD-10-CM

## 2021-10-01 ENCOUNTER — Other Ambulatory Visit: Payer: Self-pay

## 2021-10-01 ENCOUNTER — Ambulatory Visit (HOSPITAL_COMMUNITY)
Admission: RE | Admit: 2021-10-01 | Discharge: 2021-10-01 | Disposition: A | Discharge status: Home / Self Care | Payer: BC Managed Care – PPO | Source: Ambulatory Visit | Attending: Interventional Radiology | Admitting: Interventional Radiology

## 2021-10-01 DIAGNOSIS — I639 Cerebral infarction, unspecified: Secondary | ICD-10-CM | POA: Diagnosis not present

## 2021-10-01 MED ORDER — SODIUM CHLORIDE (PF) 0.9 % IJ SOLN
INTRAMUSCULAR | Status: AC
  Filled 2021-10-01: qty 50

## 2021-10-01 MED ORDER — IOHEXOL 350 MG/ML SOLN
75.0000 mL | Freq: Once | INTRAVENOUS | Status: AC | PRN
Start: 2021-10-01 — End: 2021-10-01
  Administered 2021-10-01: 75 mL via INTRAVENOUS

## 2021-10-04 ENCOUNTER — Telehealth (HOSPITAL_COMMUNITY): Payer: Self-pay

## 2021-10-04 NOTE — Telephone Encounter (Signed)
Called pt regarding recent imaging, no answer, left vm. AW  

## 2021-10-04 NOTE — Telephone Encounter (Signed)
Pt agreed to f/u in 1 year with a cta. She noted that she did have a reaction to the contrast this time. She had lots of nausea, stomach aches, and a headache for the remainder of the day. She has never had a reaction before and the tech told her that it can be hit or miss sometimes. Will let the PA know so that we can address before next scan. Pt agreed. AW

## 2021-10-05 ENCOUNTER — Other Ambulatory Visit: Payer: Self-pay | Admitting: Family Medicine

## 2021-10-05 DIAGNOSIS — Z1231 Encounter for screening mammogram for malignant neoplasm of breast: Secondary | ICD-10-CM

## 2021-11-19 ENCOUNTER — Ambulatory Visit
Admission: RE | Admit: 2021-11-19 | Discharge: 2021-11-19 | Disposition: A | Discharge status: Home / Self Care | Payer: BC Managed Care – PPO | Source: Ambulatory Visit

## 2021-11-19 DIAGNOSIS — Z1231 Encounter for screening mammogram for malignant neoplasm of breast: Secondary | ICD-10-CM

## 2022-01-24 DIAGNOSIS — Z0289 Encounter for other administrative examinations: Secondary | ICD-10-CM

## 2022-02-01 ENCOUNTER — Ambulatory Visit (INDEPENDENT_AMBULATORY_CARE_PROVIDER_SITE_OTHER): Payer: BC Managed Care – PPO | Admitting: Bariatrics

## 2022-02-01 ENCOUNTER — Encounter (INDEPENDENT_AMBULATORY_CARE_PROVIDER_SITE_OTHER): Payer: Self-pay | Admitting: Bariatrics

## 2022-02-01 VITALS — BP 122/82 | Ht 68.0 in | Wt 203.0 lb

## 2022-02-01 DIAGNOSIS — G43809 Other migraine, not intractable, without status migrainosus: Secondary | ICD-10-CM

## 2022-02-01 DIAGNOSIS — R87619 Unspecified abnormal cytological findings in specimens from cervix uteri: Secondary | ICD-10-CM | POA: Insufficient documentation

## 2022-02-01 DIAGNOSIS — C530 Malignant neoplasm of endocervix: Secondary | ICD-10-CM | POA: Insufficient documentation

## 2022-02-01 DIAGNOSIS — E669 Obesity, unspecified: Secondary | ICD-10-CM

## 2022-02-01 DIAGNOSIS — R413 Other amnesia: Secondary | ICD-10-CM | POA: Insufficient documentation

## 2022-02-01 DIAGNOSIS — J309 Allergic rhinitis, unspecified: Secondary | ICD-10-CM | POA: Insufficient documentation

## 2022-02-01 DIAGNOSIS — Z8679 Personal history of other diseases of the circulatory system: Secondary | ICD-10-CM | POA: Insufficient documentation

## 2022-02-01 DIAGNOSIS — R632 Polyphagia: Secondary | ICD-10-CM | POA: Diagnosis not present

## 2022-02-01 DIAGNOSIS — E559 Vitamin D deficiency, unspecified: Secondary | ICD-10-CM

## 2022-02-01 DIAGNOSIS — R5383 Other fatigue: Secondary | ICD-10-CM

## 2022-02-01 DIAGNOSIS — Z8673 Personal history of transient ischemic attack (TIA), and cerebral infarction without residual deficits: Secondary | ICD-10-CM

## 2022-02-01 DIAGNOSIS — R0602 Shortness of breath: Secondary | ICD-10-CM | POA: Diagnosis not present

## 2022-02-01 DIAGNOSIS — N943 Premenstrual tension syndrome: Secondary | ICD-10-CM | POA: Insufficient documentation

## 2022-02-01 DIAGNOSIS — Z95828 Presence of other vascular implants and grafts: Secondary | ICD-10-CM | POA: Insufficient documentation

## 2022-02-01 DIAGNOSIS — Z1331 Encounter for screening for depression: Secondary | ICD-10-CM

## 2022-02-01 DIAGNOSIS — D069 Carcinoma in situ of cervix, unspecified: Secondary | ICD-10-CM | POA: Insufficient documentation

## 2022-02-01 DIAGNOSIS — K219 Gastro-esophageal reflux disease without esophagitis: Secondary | ICD-10-CM | POA: Diagnosis not present

## 2022-02-01 DIAGNOSIS — I8390 Asymptomatic varicose veins of unspecified lower extremity: Secondary | ICD-10-CM | POA: Insufficient documentation

## 2022-02-01 DIAGNOSIS — R7309 Other abnormal glucose: Secondary | ICD-10-CM

## 2022-02-01 DIAGNOSIS — Z6830 Body mass index (BMI) 30.0-30.9, adult: Secondary | ICD-10-CM

## 2022-02-01 DIAGNOSIS — G43909 Migraine, unspecified, not intractable, without status migrainosus: Secondary | ICD-10-CM | POA: Insufficient documentation

## 2022-02-01 DIAGNOSIS — E668 Other obesity: Secondary | ICD-10-CM

## 2022-02-02 LAB — COMPREHENSIVE METABOLIC PANEL
ALT: 17 IU/L (ref 0–32)
AST: 15 IU/L (ref 0–40)
Albumin/Globulin Ratio: 1.8 (ref 1.2–2.2)
Albumin: 4.5 g/dL (ref 3.8–4.8)
Alkaline Phosphatase: 93 IU/L (ref 44–121)
BUN/Creatinine Ratio: 15 (ref 9–23)
BUN: 9 mg/dL (ref 6–24)
Bilirubin Total: 0.6 mg/dL (ref 0.0–1.2)
CO2: 27 mmol/L (ref 20–29)
Calcium: 9.2 mg/dL (ref 8.7–10.2)
Chloride: 100 mmol/L (ref 96–106)
Creatinine, Ser: 0.6 mg/dL (ref 0.57–1.00)
Globulin, Total: 2.5 g/dL (ref 1.5–4.5)
Glucose: 76 mg/dL (ref 70–99)
Potassium: 4.7 mmol/L (ref 3.5–5.2)
Sodium: 141 mmol/L (ref 134–144)
Total Protein: 7 g/dL (ref 6.0–8.5)
eGFR: 113 mL/min/{1.73_m2} (ref >59)

## 2022-02-02 LAB — LIPID PANEL WITH LDL/HDL RATIO
Cholesterol, Total: 215 mg/dL — ABNORMAL HIGH (ref 100–199)
HDL: 48 mg/dL (ref >39)
LDL Chol Calc (NIH): 137 mg/dL — ABNORMAL HIGH (ref 0–99)
LDL/HDL Ratio: 2.9 ratio (ref 0.0–3.2)
Triglycerides: 168 mg/dL — ABNORMAL HIGH (ref 0–149)
VLDL Cholesterol Cal: 30 mg/dL (ref 5–40)

## 2022-02-02 LAB — TSH+T4F+T3FREE
Free T4: 1.06 ng/dL (ref 0.82–1.77)
T3, Free: 3.3 pg/mL (ref 2.0–4.4)
TSH: 0.658 u[IU]/mL (ref 0.450–4.500)

## 2022-02-02 LAB — HEMOGLOBIN A1C
Est. average glucose Bld gHb Est-mCnc: 103 mg/dL
Hgb A1c MFr Bld: 5.2 % (ref 4.8–5.6)

## 2022-02-02 LAB — VITAMIN D 25 HYDROXY (VIT D DEFICIENCY, FRACTURES): Vit D, 25-Hydroxy: 36.6 ng/mL (ref 30.0–100.0)

## 2022-02-02 LAB — INSULIN, RANDOM: INSULIN: 6.4 u[IU]/mL (ref 2.6–24.9)

## 2022-02-03 NOTE — Progress Notes (Signed)
Chief Complaint:   OBESITY Vanessa Sharp Sharp (MR# [id number]) is a 46 y.o. female who presents for evaluation and treatment of obesity and related comorbidities. Current BMI is Body mass index is 30.87 kg/m. Vanessa Sharp has been struggling with her weight for many years and has been unsuccessful in either losing weight, maintaining weight loss, or reaching her healthy weight goal.  Vanessa Sharp Sharp states that she likes to cook, but notes time as an obstacle. She states that she craves carbohydrates.   Vanessa Sharp is currently in the action stage of change and ready to dedicate time achieving and maintaining a healthier weight. Vanessa Sharp is interested in becoming our patient and working on intensive lifestyle modifications including (but not limited to) diet and exercise for weight loss.  Vanessa Sharp's habits were reviewed today and are as follows: Her family eats meals together, she thinks her family will eat healthier with her, her desired weight loss is 38 pounds, she started gaining weight in 2022, her heaviest weight ever was 203 pounds, she has significant food cravings issues, she snacks frequently in the evenings, she wakes up frequently in the middle of the night to eat, she frequently makes poor food choices, she has problems with excessive hunger, she frequently eats larger portions than normal, and she struggles with emotional eating.  Depression Screen Vanessa Sharp's Food and Mood (modified PHQ-9) score was 16.     02/01/2022    7:43 AM  Depression screen PHQ 2/9  Decreased Interest 1  Down, Depressed, Hopeless 1  PHQ - 2 Score 2  Altered sleeping 1  Tired, decreased energy 3  Change in appetite 2  Feeling bad or failure about yourself  3  Trouble concentrating 3  Moving slowly or fidgety/restless 2  Suicidal thoughts 0  PHQ-9 Score 16  Difficult doing work/chores Somewhat difficult   Subjective:   1. Other fatigue Vanessa Sharp denies daytime somnolence and denies waking up still tired.  Patient has a history of symptoms of daytime fatigue. Vanessa Sharp generally gets 8 hours of sleep per night, and states that she has difficulty falling asleep and generally restful sleep. Snoring is present. Apneic episodes are not present. Epworth Sleepiness Score is 8.  Vanessa Sharp will continue activities.   2. SOB (shortness of breath) on exertion Vanessa Sharp notes increasing shortness of breath with exercising and seems to be worsening over time with weight gain. She notes getting out of breath sooner with activity than she used to. This has not gotten worse recently. Vanessa Sharp denies shortness of breath at rest or orthopnea.   3. Vitamin D deficiency Vanessa Sharp Sharp is currently taking Vitamin D over the counter.   4. History of ischemic stroke Vanessa Sharp has a history of middle cerebral artery embolism at age 64.   101. Gastroesophageal reflux disease without esophagitis Vanessa Sharp is not currently on medications.   6. Other migraine without status migrainosus, not intractable Vanessa Sharp denies aura. She notes no current migraines.   7. Polyphagia She is trying to make Vanessa Sharp choices mainly for drinks and eating.   8. Elevated glucose Vanessa Sharp is no on medications currently.   Assessment/Plan:   1. Other fatigue Vanessa Sharp does feel that her weight is causing her energy to be lower than it should be. Fatigue may be related to obesity, depression or many other causes. Labs will be ordered, and in the meanwhile, Vanessa Sharp will focus on self care including making healthy food choices, increasing physical activity and focusing on stress reduction. Vanessa Sharp will gradually increase activities and exercise. We will  review EKG and check TSH today.   - EKG 12-Lead - TSH+T4F+T3Free  2. SOB (shortness of breath) on exertion Vanessa Sharp does feel that she gets out of breath more easily that she used to when she exercises. Vanessa Sharp's shortness of breath appears to be obesity related and exercise induced. She has agreed to work on  weight loss and gradually increase exercise to treat her exercise induced shortness of breath. Will continue to monitor closely.  - TSH+T4F+T3Free  3. Vitamin D deficiency Low Vitamin D level contributes to fatigue and are associated with obesity, breast, and colon cancer. Vanessa Sharp agrees to continue over the counter Vitamin D daily and she will follow-up for routine testing of Vitamin D, at least 2-3 times per year to avoid over-replacement. We will check Vitamin D today.   - VITAMIN D 25 Hydroxy (Vit-D Deficiency, Fractures)  4. History of ischemic stroke Vanessa Sharp Sharp will keep her cholesterol at an acceptable level. She will follow up with specialists, as needed. We will check insulin and lipid panel today.   - Insulin, random - Lipid Panel With LDL/HDL Ratio  5. Gastroesophageal reflux disease without esophagitis Intensive lifestyle modifications are the first line treatment for this issue. Vanessa Sharp will control her triggers. We discussed several lifestyle modifications today and she will continue to work on diet, exercise and weight loss efforts. Orders and follow up as documented in patient record.   Counseling If a person has gastroesophageal reflux disease (GERD), food and stomach acid move back up into the esophagus and cause symptoms or problems such as damage to the esophagus. Anti-reflux measures include: raising the head of the bed, avoiding tight clothing or belts, avoiding eating late at night, not lying down shortly after mealtime, and achieving weight loss. Avoid ASA, NSAID's, caffeine, alcohol, and tobacco.  OTC Pepcid and/or Tums are often very helpful for as needed use.  However, for persisting chronic or daily symptoms, stronger medications like Omeprazole may be needed. You may need to avoid foods and drinks such as: Coffee and tea (with or without caffeine). Drinks that contain alcohol. Energy drinks and sports drinks. Bubbly (carbonated) drinks or sodas. Chocolate and  cocoa. Peppermint and mint flavorings. Garlic and onions. Horseradish. Spicy and acidic foods. These include peppers, chili powder, curry powder, vinegar, hot sauces, and BBQ sauce. Citrus fruit juices and citrus fruits, such as oranges, lemons, and limes. Tomato-based foods. These include red sauce, chili, salsa, and pizza with red sauce. Fried and fatty foods. These include donuts, french fries, potato chips, and high-fat dressings. High-fat meats. These include hot dogs, rib eye steak, sausage, ham, and bacon.   6. Other migraine without status migrainosus, not intractable Vanessa Sharp will follow up with her primary care physician and neurologist as needed.   7. Polyphagia Intensive lifestyle modifications are the first line treatment for this issue. Vanessa Sharp will discuss options at her next visit for possible medications. We discussed several lifestyle modifications today and she will continue to work on diet, exercise and weight loss efforts. Orders and follow up as documented in patient record.  Counseling Polyphagia is excessive hunger. Causes can include: low blood sugars, hypERthyroidism, PMS, lack of sleep, stress, insulin resistance, diabetes, certain medications, and diets that are deficient in protein and fiber.    8. Elevated glucose We will check insulin and A1C today.   - Comprehensive metabolic panel - Hemoglobin A1c - Insulin, random - Lipid Panel With LDL/HDL Ratio  9. Depression screening Vanessa Sharp had a positive depression screening. Depression is commonly  associated with obesity and often results in emotional eating behaviors. We will monitor this closely and work on CBT to help improve the non-hunger eating patterns. Referral to Psychology may be required if no improvement is seen as she continues in our clinic.   10. Class 1 obesity with serious comorbidity and body mass index (BMI) of 30.0 to 30.9 in adult, unspecified obesity type Vanessa Sharp is currently in the action  stage of change and her goal is to continue with weight loss efforts. I recommend Vanessa Sharp begin the structured treatment plan as follows:  She has agreed to the Category 3 Plan.  Vanessa Sharp will continue to meal planning and she will continue intentional eating. She will have no sugary drinks.   Exercise goals: No exercise has been prescribed at this time.   Behavioral modification strategies: increasing lean protein intake, decreasing simple carbohydrates, increasing vegetables, increasing water intake, decreasing eating out, no skipping meals, meal planning and cooking strategies, keeping healthy foods in the home, and planning for success.  She was informed of the importance of frequent follow-up visits to maximize her success with intensive lifestyle modifications for her multiple health conditions. She was informed we would discuss her lab results at her next visit unless there is a critical issue that needs to be addressed sooner. Vanessa Sharp agreed to keep her next visit at the agreed upon time to discuss these results.  Objective:   Blood pressure 122/82, height '5\' 8"'$  (1.727 m), weight 203 lb (92.1 kg), last menstrual period 05/15/2017. Body mass index is 30.87 kg/m.  EKG: Normal sinus rhythm, rate 92 bpm.  Indirect Calorimeter completed today shows a VO2 of 293 and a REE of 2016.  Her calculated basal metabolic rate is 6440 thus her basal metabolic rate is better than expected.  General: Cooperative, alert, well developed, in no acute distress. HEENT: Conjunctivae and lids unremarkable. Cardiovascular: Regular rhythm.  Lungs: Normal work of breathing. Neurologic: No focal deficits.   Lab Results  Component Value Date   CREATININE 0.60 02/01/2022   BUN 9 02/01/2022   NA 141 02/01/2022   K 4.7 02/01/2022   CL 100 02/01/2022   CO2 27 02/01/2022   Lab Results  Component Value Date   ALT 17 02/01/2022   AST 15 02/01/2022   ALKPHOS 93 02/01/2022   BILITOT 0.6 02/01/2022   Lab  Results  Component Value Date   HGBA1C 5.2 02/01/2022   HGBA1C 5.1 11/09/2018   Lab Results  Component Value Date   INSULIN 6.4 02/01/2022   Lab Results  Component Value Date   TSH 0.658 02/01/2022   Lab Results  Component Value Date   CHOL 215 (H) 02/01/2022   HDL 48 02/01/2022   LDLCALC 137 (H) 02/01/2022   TRIG 168 (H) 02/01/2022   CHOLHDL 2.8 11/09/2018   Lab Results  Component Value Date   WBC 8.9 09/08/2020   HGB 15.2 (H) 09/08/2020   HCT 47.0 (H) 09/08/2020   MCV 90.0 09/08/2020   PLT 224 09/08/2020   No results found for: IRON, TIBC, FERRITIN  Attestation Statements:   Reviewed by clinician on day of visit: allergies, medications, problem list, medical history, surgical history, family history, social history, and previous encounter notes.  I, Lizbeth Bark, RMA, am acting as Location manager for [employer], DO.  I have reviewed the above documentation for accuracy and completeness, and I agree with the above. Jearld Lesch, DO

## 2022-02-15 ENCOUNTER — Ambulatory Visit (INDEPENDENT_AMBULATORY_CARE_PROVIDER_SITE_OTHER): Payer: BC Managed Care – PPO | Admitting: Bariatrics

## 2022-02-16 ENCOUNTER — Ambulatory Visit (INDEPENDENT_AMBULATORY_CARE_PROVIDER_SITE_OTHER): Payer: BC Managed Care – PPO | Admitting: Bariatrics

## 2022-02-16 ENCOUNTER — Encounter (INDEPENDENT_AMBULATORY_CARE_PROVIDER_SITE_OTHER): Payer: Self-pay | Admitting: Bariatrics

## 2022-02-16 DIAGNOSIS — R87618 Other abnormal cytological findings on specimens from cervix uteri: Secondary | ICD-10-CM | POA: Insufficient documentation

## 2022-02-16 DIAGNOSIS — E785 Hyperlipidemia, unspecified: Secondary | ICD-10-CM | POA: Insufficient documentation

## 2022-02-17 ENCOUNTER — Encounter (INDEPENDENT_AMBULATORY_CARE_PROVIDER_SITE_OTHER): Payer: Self-pay | Admitting: Bariatrics

## 2022-02-22 ENCOUNTER — Encounter (INDEPENDENT_AMBULATORY_CARE_PROVIDER_SITE_OTHER): Payer: Self-pay | Admitting: Bariatrics

## 2022-02-22 ENCOUNTER — Ambulatory Visit (INDEPENDENT_AMBULATORY_CARE_PROVIDER_SITE_OTHER): Payer: BC Managed Care – PPO | Admitting: Bariatrics

## 2022-02-22 VITALS — BP 117/78 | HR 99 | Temp 98.1°F | Ht 68.0 in | Wt 205.0 lb

## 2022-02-22 DIAGNOSIS — R632 Polyphagia: Secondary | ICD-10-CM | POA: Diagnosis not present

## 2022-02-22 DIAGNOSIS — E7800 Pure hypercholesterolemia, unspecified: Secondary | ICD-10-CM

## 2022-02-22 DIAGNOSIS — E669 Obesity, unspecified: Secondary | ICD-10-CM | POA: Diagnosis not present

## 2022-02-22 DIAGNOSIS — Z7985 Long-term (current) use of injectable non-insulin antidiabetic drugs: Secondary | ICD-10-CM

## 2022-02-22 DIAGNOSIS — E559 Vitamin D deficiency, unspecified: Secondary | ICD-10-CM

## 2022-02-22 DIAGNOSIS — Z6831 Body mass index (BMI) 31.0-31.9, adult: Secondary | ICD-10-CM

## 2022-02-22 MED ORDER — WEGOVY 0.25 MG/0.5ML ~~LOC~~ SOAJ: 0.2500 mg | SUBCUTANEOUS | 0 refills | Status: DC

## 2022-02-22 MED ORDER — VITAMIN D (ERGOCALCIFEROL) 1.25 MG (50000 UNIT) PO CAPS
50000.0000 [IU] | ORAL_CAPSULE | ORAL | 0 refills | Status: DC
Start: 2022-02-22 — End: 2022-04-04

## 2022-02-24 ENCOUNTER — Encounter (INDEPENDENT_AMBULATORY_CARE_PROVIDER_SITE_OTHER): Payer: Self-pay

## 2022-02-24 ENCOUNTER — Telehealth (INDEPENDENT_AMBULATORY_CARE_PROVIDER_SITE_OTHER): Payer: Self-pay | Admitting: Bariatrics

## 2022-02-24 NOTE — Telephone Encounter (Signed)
Dr. Owens Shark - Prior authorization approved for [id number]. Effective: 02/22/2022 - 09/24/2022. Patient sent approval message via mychart.

## 2022-02-24 NOTE — Progress Notes (Signed)
Chief Complaint:   OBESITY Vanessa Sharp is here to discuss her progress with her obesity treatment plan along with follow-up of her obesity related diagnoses. Vanessa Sharp is on the Category 3 Plan and states she is following her eating plan approximately 100% of the time. Vanessa Sharp states she is walking 60 minutes 5 times per week.  Today's visit was #: 2 Starting weight: 203 lbs Starting date: 02/01/2022 Today's weight: 205 lbs Today's date: 02/22/2022 Total lbs lost to date: 0 Total lbs lost since last in-office visit: 0  Interim History: Vanessa Sharp is up 2 lbs since her last visit. She was really hungry the 1st few days.   Subjective:   1. Elevated cholesterol Vanessa Sharp was put on statin in the hospital. Her total cholesterol was 215. Triglycerides was 168. LDL was 137. She has a history of stroke with stents. 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   2. Vitamin D deficiency Vanessa Sharp is taking Vitamin D. She had a CT/calcium score. Her last Vitamin D level was 36.6.  3. Polyphagia Vanessa Sharp is not on absolute contraindications.   Assessment/Plan:   1. Elevated cholesterol Cardiovascular risk and specific lipid/LDL goals reviewed.  Vanessa Sharp will follow up with her neurologist in June who follows her post stroke. Marland Kitchen She will follow up with her primary care physician. We will watch cholesterol  and repeat her labs in the future. We discussed several lifestyle modifications today and Vanessa Sharp will continue to work on diet, exercise and weight loss efforts. Orders and follow up as documented in patient record.   Counseling Intensive lifestyle modifications are the first line treatment for this issue. Dietary changes: Increase soluble fiber. Decrease simple carbohydrates. Exercise changes: Moderate to vigorous-intensity aerobic activity 150 minutes per week if tolerated. Lipid-lowering medications: see documented in medical record.  2. Vitamin D  deficiency Low Vitamin D level contributes to fatigue and are associated with obesity, breast, and colon cancer. We will refill prescription Vitamin D 50,000 IU every week for 1 month with no refills and Vanessa Sharp will follow-up for routine testing of Vitamin D, at least 2-3 times per year to avoid over-replacement.  - Vitamin D, Ergocalciferol, (DRISDOL) 1.25 MG (50000 UNIT) CAPS capsule; Take 1 capsule (50,000 Units total) by mouth every 7 (seven) days.  Dispense: 12 capsule; Refill: 0  3. Polyphagia Intensive lifestyle modifications are the first line treatment for this issue. We will refill Wegovy 0.25 mg for 1 month with no refills. We discussed several lifestyle modifications today and she will continue to work on diet, exercise and weight loss efforts. Orders and follow up as documented in patient record.  Counseling Polyphagia is excessive hunger. Causes can include: low blood sugars, hypERthyroidism, PMS, lack of sleep, stress, insulin resistance, diabetes, certain medications, and diets that are deficient in protein and fiber.    - Semaglutide-Weight Management (WEGOVY) 0.25 MG/0.5ML SOAJ; Inject 0.25 mg into the skin once a week.  Dispense: 2 mL; Refill: 0  4. Obesity, Current BMI 31.2 Vanessa Sharp is currently in the action stage of change. As such, her goal is to continue with weight loss efforts. She has agreed to the Category 2 Plan and keeping a food journal and adhering to recommended goals of 1300 calories and 80 grams of protein plus 100 calories.   Vanessa Sharp will continue meal planning. I reviewed labs in detail with Vanessa Sharp (CMP, Lipids, A1C, insulin, and thyroid panel). Dining Out Guide was provided today.   Exercise goals:  Vanessa Sharp is walking 3 miles a week.   Behavioral modification strategies: increasing lean protein intake, decreasing simple carbohydrates, increasing vegetables, increasing water intake, decreasing eating out, no skipping meals, meal planning and cooking  strategies, keeping healthy foods in the home, and planning for success.  Vanessa Sharp has agreed to follow-up with our clinic in 2 weeks. She was informed of the importance of frequent follow-up visits to maximize her success with intensive lifestyle modifications for her multiple health conditions.   Objective:   Blood pressure 117/78, pulse 99, temperature 98.1 F (36.7 C), height '5\' 8"'$  (1.727 m), weight 205 lb (93 kg), last menstrual period 05/15/2017, SpO2 98 %. Body mass index is 31.17 kg/m.  General: Cooperative, alert, well developed, in no acute distress. HEENT: Conjunctivae and lids unremarkable. Cardiovascular: Regular rhythm.  Lungs: Normal work of breathing. Neurologic: No focal deficits.   Lab Results  Component Value Date   CREATININE 0.60 02/01/2022   BUN 9 02/01/2022   NA 141 02/01/2022   K 4.7 02/01/2022   CL 100 02/01/2022   CO2 27 02/01/2022   Lab Results  Component Value Date   ALT 17 02/01/2022   AST 15 02/01/2022   ALKPHOS 93 02/01/2022   BILITOT 0.6 02/01/2022   Lab Results  Component Value Date   HGBA1C 5.2 02/01/2022   HGBA1C 5.1 11/09/2018   Lab Results  Component Value Date   INSULIN 6.4 02/01/2022   Lab Results  Component Value Date   TSH 0.658 02/01/2022   Lab Results  Component Value Date   CHOL 215 (H) 02/01/2022   HDL 48 02/01/2022   LDLCALC 137 (H) 02/01/2022   TRIG 168 (H) 02/01/2022   CHOLHDL 2.8 11/09/2018   Lab Results  Component Value Date   VD25OH 36.6 02/01/2022   Lab Results  Component Value Date   WBC 8.9 09/08/2020   HGB 15.2 (H) 09/08/2020   HCT 47.0 (H) 09/08/2020   MCV 90.0 09/08/2020   PLT 224 09/08/2020   No results found for: IRON, TIBC, FERRITIN  Attestation Statements:   Reviewed by clinician on day of visit: allergies, medications, problem list, medical history, surgical history, family history, social history, and previous encounter notes.  I, Lizbeth Bark, RMA, am acting as Location manager for  [employer], DO.  I have reviewed the above documentation for accuracy and completeness, and I agree with the above. Jearld Lesch, DO

## 2022-03-01 ENCOUNTER — Encounter (INDEPENDENT_AMBULATORY_CARE_PROVIDER_SITE_OTHER): Payer: Self-pay | Admitting: Bariatrics

## 2022-03-02 ENCOUNTER — Ambulatory Visit: Payer: BC Managed Care – PPO | Admitting: Dermatology

## 2022-03-02 ENCOUNTER — Ambulatory Visit (INDEPENDENT_AMBULATORY_CARE_PROVIDER_SITE_OTHER): Payer: BC Managed Care – PPO | Admitting: Bariatrics

## 2022-03-02 DIAGNOSIS — L988 Other specified disorders of the skin and subcutaneous tissue: Secondary | ICD-10-CM

## 2022-03-02 DIAGNOSIS — D489 Neoplasm of uncertain behavior, unspecified: Secondary | ICD-10-CM | POA: Diagnosis not present

## 2022-03-02 DIAGNOSIS — D235 Other benign neoplasm of skin of trunk: Secondary | ICD-10-CM | POA: Diagnosis not present

## 2022-03-02 DIAGNOSIS — L700 Acne vulgaris: Secondary | ICD-10-CM

## 2022-03-02 NOTE — Patient Instructions (Addendum)
Biopsy Wound Care Instructions  Leave the original bandage on for 24 hours if possible.  If the bandage becomes soaked or soiled before that time, it is OK to remove it and examine the wound.  A small amount of post-operative bleeding is normal.  If excessive bleeding occurs, remove the bandage, place gauze over the site and apply continuous pressure (no peeking) over the area for 30 minutes. If this does not work, please call our clinic as soon as possible or page your doctor if it is after hours.   Once a day, cleanse the wound with soap and water. It is fine to shower. If a thick crust develops you may use a Q-tip dipped into dilute hydrogen peroxide (mix 1:1 with water) to dissolve it.  Hydrogen peroxide can slow the healing process, so use it only as needed.    After washing, apply petroleum jelly (Vaseline) or an antibiotic ointment if your doctor prescribed one for you, followed by a bandage.    For best healing, the wound should be covered with a layer of ointment at all times. If you are not able to keep the area covered with a bandage to hold the ointment in place, this may mean re-applying the ointment several times a day.  Continue this wound care until the wound has healed and is no longer open.   Itching and mild discomfort is normal during the healing process. However, if you develop pain or severe itching, please call our office.   If you have any discomfort, you can take Tylenol (acetaminophen) or ibuprofen as directed on the bottle. (Please do not take these if you have an allergy to them or cannot take them for another reason).  Some redness, tenderness and white or yellow material in the wound is normal healing.  If the area becomes very sore and red, or develops a thick yellow-green material (pus), it may be infected; please notify us.    If you have stitches, return to clinic as directed to have the stitches removed. You will continue wound care for 2-3 days after the stitches  are removed.   Wound healing continues for up to one year following surgery. It is not unusual to experience pain in the scar from time to time during the interval.  If the pain becomes severe or the scar thickens, you should notify the office.    A slight amount of redness in a scar is expected for the first six months.  After six months, the redness will fade and the scar will soften and fade.  The color difference becomes less noticeable with time.  If there are any problems, return for a post-op surgery check at your earliest convenience.  To improve the appearance of the scar, you can use silicone scar gel, cream, or sheets (such as Mederma or Serica) every night for up to one year. These are available over the counter (without a prescription).  Please call our office at [phone] for any questions or concerns.     Due to recent changes in healthcare laws, you may see results of your pathology and/or laboratory studies on MyChart before the doctors have had a chance to review them. We understand that in some cases there may be results that are confusing or concerning to you. Please understand that not all results are received at the same time and often the doctors may need to interpret multiple results in order to provide you with the best plan of care or course   of treatment. Therefore, we ask that you please give us 2 business days to thoroughly review all your results before contacting the office for clarification. Should we see a critical lab result, you will be contacted sooner.   If You Need Anything After Your Visit  If you have any questions or concerns for your doctor, please call our main line at [phone] and press option 4 to reach your doctor's medical assistant. If no one answers, please leave a voicemail as directed and we will return your call as soon as possible. Messages left after 4 pm will be answered the following business day.   You may also send us a message via  MyChart. We typically respond to MyChart messages within 1-2 business days.  For prescription refills, please ask your pharmacy to contact our office. Our fax number is [phone].  If you have an urgent issue when the clinic is closed that cannot wait until the next business day, you can page your doctor at the number below.    Please note that while we do our best to be available for urgent issues outside of office hours, we are not available 24/7.   If you have an urgent issue and are unable to reach us, you may choose to seek medical care at your doctor's office, retail clinic, urgent care center, or emergency room.  If you have a medical emergency, please immediately call 911 or go to the emergency department.  Pager Numbers  - Dr. Kowalski: [phone]  - Dr. Moye: [phone]  - Dr. Stewart: [phone]  In the event of inclement weather, please call our main line at [phone] for an update on the status of any delays or closures.  Dermatology Medication Tips: Please keep the boxes that topical medications come in in order to help keep track of the instructions about where and how to use these. Pharmacies typically print the medication instructions only on the boxes and not directly on the medication tubes.   If your medication is too expensive, please contact our office at [phone] option 4 or send us a message through MyChart.   We are unable to tell what your co-pay for medications will be in advance as this is different depending on your insurance coverage. However, we may be able to find a substitute medication at lower cost or fill out paperwork to get insurance to cover a needed medication.   If a prior authorization is required to get your medication covered by your insurance company, please allow us 1-2 business days to complete this process.  Drug prices often vary depending on where the prescription is filled and some pharmacies may offer cheaper  prices.  The website www.goodrx.com contains coupons for medications through different pharmacies. The prices here do not account for what the cost may be with help from insurance (it may be cheaper with your insurance), but the website can give you the price if you did not use any insurance.  - You can print the associated coupon and take it with your prescription to the pharmacy.  - You may also stop by our office during regular business hours and pick up a GoodRx coupon card.  - If you need your prescription sent electronically to a different pharmacy, notify our office through [facility] MyChart or by phone at [phone] option 4.     Si Usted Necesita Algo Despus de Su Visita  Tambin puede enviarnos un mensaje a travs de MyChart. Por lo general respondemos a los   mensajes de MyChart en el transcurso de 1 a 2 das hbiles.  Para renovar recetas, por favor pida a su farmacia que se ponga en contacto con nuestra oficina. Nuestro nmero de fax es el [phone].  Si tiene un asunto urgente cuando la clnica est cerrada y que no puede esperar hasta el siguiente da hbil, puede llamar/localizar a su doctor(a) al nmero que aparece a continuacin.   Por favor, tenga en cuenta que aunque hacemos todo lo posible para estar disponibles para asuntos urgentes fuera del horario de oficina, no estamos disponibles las 24 horas del da, los 7 das de la semana.   Si tiene un problema urgente y no puede comunicarse con nosotros, puede optar por buscar atencin mdica  en el consultorio de su doctor(a), en una clnica privada, en un centro de atencin urgente o en una sala de emergencias.  Si tiene una emergencia mdica, por favor llame inmediatamente al 911 o vaya a la sala de emergencias.  Nmeros de bper  - Dr. Kowalski: [phone]  - Dra. Moye: [phone]  - Dra. Stewart: [phone]  En caso de inclemencias del tiempo, por favor llame a nuestra lnea principal al [phone]  para una actualizacin sobre el estado de cualquier retraso o cierre.  Consejos para la medicacin en dermatologa: Por favor, guarde las cajas en las que vienen los medicamentos de uso tpico para ayudarle a seguir las instrucciones sobre dnde y cmo usarlos. Las farmacias generalmente imprimen las instrucciones del medicamento slo en las cajas y no directamente en los tubos del medicamento.   Si su medicamento es muy caro, por favor, pngase en contacto con nuestra oficina llamando al [phone] y presione la opcin 4 o envenos un mensaje a travs de MyChart.   No podemos decirle cul ser su copago por los medicamentos por adelantado ya que esto es diferente dependiendo de la cobertura de su seguro. Sin embargo, es posible que podamos encontrar un medicamento sustituto a menor costo o llenar un formulario para que el seguro cubra el medicamento que se considera necesario.   Si se requiere una autorizacin previa para que su compaa de seguros cubra su medicamento, por favor permtanos de 1 a 2 das hbiles para completar este proceso.  Los precios de los medicamentos varan con frecuencia dependiendo del lugar de dnde se surte la receta y alguna farmacias pueden ofrecer precios ms baratos.  El sitio web www.goodrx.com tiene cupones para medicamentos de diferentes farmacias. Los precios aqu no tienen en cuenta lo que podra costar con la ayuda del seguro (puede ser ms barato con su seguro), pero el sitio web puede darle el precio si no utiliz ningn seguro.  - Puede imprimir el cupn correspondiente y llevarlo con su receta a la farmacia.  - Tambin puede pasar por nuestra oficina durante el horario de atencin regular y recoger una tarjeta de cupones de GoodRx.  - Si necesita que su receta se enve electrnicamente a una farmacia diferente, informe a nuestra oficina a travs de MyChart de [place] o por telfono llamando al [phone] y presione la opcin 4.  

## 2022-03-02 NOTE — Progress Notes (Signed)
   Follow-Up Visit   Subjective  Vanessa Sharp is a 46 y.o. female who presents for the following: Other (Patient reports a painful spot at left inner thigh that maybe got pinched and looks smaller than it did before. Dark spot at right back area, not painful just like checked. Patient would also like to discuss frown lines around mouth. ).  The patient has spots, moles and lesions to be evaluated, some may be new or changing and the patient has concerns that these could be cancer.   The following portions of the chart were reviewed this encounter and updated as appropriate:      Review of Systems: No other skin or systemic complaints except as noted in HPI or Assessment and Plan.   Objective  Well appearing patient in no apparent distress; mood and affect are within normal limits.  A focused examination was performed including face, right upper back, left inner thigh. Relevant physical exam findings are noted in the Assessment and Plan.  nasolabial folds Rhytides and volume loss.   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 Anesthesia: the lesion was anesthetized in a standard fashion   Anesthetic:  1% lidocaine w/ epinephrine 1-100,000 buffered w/ 8.4% NaHCO3 Instrument used: flexible razor blade   Hemostasis achieved with: pressure, aluminum chloride and electrodesiccation   Outcome: patient tolerated procedure  well   Post-procedure details: wound care instructions given   Post-procedure details comment:  Ointment and small bandage applied.   Specimen 1 - Surgical pathology Differential Diagnosis: Irritated nevus vs irritated cyst   Check Margins: No  Irritated nevus vs irritated cyst   Dilated pore of Winer of back Right spinal upper back  Benign, observe.     Return if symptoms worsen or fail to improve. I, Ruthell Rummage, CMA, am acting as scribe for Brendolyn Patty, MD.  Documentation: I have reviewed the above documentation for accuracy and completeness, and I agree with the above.  Brendolyn Patty MD

## 2022-03-08 ENCOUNTER — Ambulatory Visit (INDEPENDENT_AMBULATORY_CARE_PROVIDER_SITE_OTHER): Payer: BC Managed Care – PPO | Admitting: Bariatrics

## 2022-03-08 ENCOUNTER — Encounter (INDEPENDENT_AMBULATORY_CARE_PROVIDER_SITE_OTHER): Payer: Self-pay | Admitting: Bariatrics

## 2022-03-08 ENCOUNTER — Telehealth: Payer: Self-pay

## 2022-03-08 VITALS — BP 109/71 | HR 89 | Temp 97.8°F | Ht 68.0 in | Wt 202.0 lb

## 2022-03-08 DIAGNOSIS — Z6830 Body mass index (BMI) 30.0-30.9, adult: Secondary | ICD-10-CM | POA: Diagnosis not present

## 2022-03-08 DIAGNOSIS — R632 Polyphagia: Secondary | ICD-10-CM

## 2022-03-08 DIAGNOSIS — Z7985 Long-term (current) use of injectable non-insulin antidiabetic drugs: Secondary | ICD-10-CM

## 2022-03-08 DIAGNOSIS — E559 Vitamin D deficiency, unspecified: Secondary | ICD-10-CM | POA: Diagnosis not present

## 2022-03-08 DIAGNOSIS — E669 Obesity, unspecified: Secondary | ICD-10-CM

## 2022-03-08 MED ORDER — WEGOVY 0.5 MG/0.5ML ~~LOC~~ SOAJ: 0.5000 mg | SUBCUTANEOUS | 0 refills | Status: DC

## 2022-03-08 NOTE — Telephone Encounter (Signed)
-----   Message from Brendolyn Patty, MD sent at 03/07/2022  1:29 PM EDT ----- Skin , left perineal thigh EPIDERMAL INCLUSION CYST  Benign cyst - please call patient

## 2022-03-08 NOTE — Telephone Encounter (Signed)
Advised patient biopsy of the left perineal thigh was a benign cyst and no further treatment needed.

## 2022-03-09 NOTE — Progress Notes (Signed)
Chief Complaint:   OBESITY Vanessa Sharp is here to discuss her progress with her obesity treatment plan along with follow-up of her obesity related diagnoses. Vanessa Sharp is on the Category 2 Plan and states she is following her eating plan approximately 100% of the time. Vanessa Sharp states she is walking for 60 minutes 3 times per week.  Today's visit was #: 3 Starting weight: 203 lbs Starting date: 02/01/2022 Today's weight: 202 lbs Today's date: 03/08/2022 Total lbs lost to date: 1  lb Total lbs lost since last in-office visit: 3 lbs  Interim History: Vanessa Sharp is down 3 lbs since her last visit.   Subjective:   1. Polyphagia Vanessa Sharp started on Wegovy. She denies side effects.   2. Vitamin D deficiency Vanessa Sharp is taking Vitamin D currently.   Assessment/Plan:   1. Polyphagia Intensive lifestyle modifications are the first line treatment for this issue. We will refill Wegovy 0.5 mg for 1 month with no refills. We discussed several lifestyle modifications today and she will continue to work on diet, exercise and weight loss efforts. Orders and follow up as documented in patient record.  Counseling Polyphagia is excessive hunger. Causes can include: low blood sugars, hypERthyroidism, PMS, lack of sleep, stress, insulin resistance, diabetes, certain medications, and diets that are deficient in protein and fiber.    - Semaglutide-Weight Management (WEGOVY) 0.5 MG/0.5ML SOAJ; Inject 0.5 mg into the skin once a week.  Dispense: 2 mL; Refill: 0  2. Vitamin D deficiency Low Vitamin D level contributes to fatigue and are associated with obesity, breast, and colon cancer. Vanessa Sharp agrees to continue to take prescription Vitamin D 50,000 IU every week and she will follow-up for routine testing of Vitamin D, at least 2-3 times per year to avoid over-replacement.  3. Obesity, Current BMI 30.8 Vanessa Sharp is currently in the action stage of change. As such, her goal is to continue with weight loss  efforts. She has agreed to the Category 2 Plan and keeping a food journal and adhering to recommended goals of 1200 calories and 80 grams of protein.   Vanessa Sharp will continue meal planning. Snacks sheet was provided today.   Exercise goals:  As is.   Behavioral modification strategies: increasing lean protein intake, decreasing simple carbohydrates, increasing vegetables, increasing water intake, decreasing eating out, no skipping meals, meal planning and cooking strategies, keeping healthy foods in the home, and planning for success.  Vanessa Sharp has agreed to follow-up with our clinic in 2 weeks. She was informed of the importance of frequent follow-up visits to maximize her success with intensive lifestyle modifications for her multiple health conditions.   Objective:   Blood pressure 109/71, pulse 89, temperature 97.8 F (36.6 C), height '5\' 8"'$  (1.727 m), weight 202 lb (91.6 kg), last menstrual period 05/15/2017, SpO2 97 %. Body mass index is 30.71 kg/m.  General: Cooperative, alert, well developed, in no acute distress. HEENT: Conjunctivae and lids unremarkable. Cardiovascular: Regular rhythm.  Lungs: Normal work of breathing. Neurologic: No focal deficits.   Lab Results  Component Value Date   CREATININE 0.60 02/01/2022   BUN 9 02/01/2022   NA 141 02/01/2022   K 4.7 02/01/2022   CL 100 02/01/2022   CO2 27 02/01/2022   Lab Results  Component Value Date   ALT 17 02/01/2022   AST 15 02/01/2022   ALKPHOS 93 02/01/2022   BILITOT 0.6 02/01/2022   Lab Results  Component Value Date   HGBA1C 5.2 02/01/2022   HGBA1C 5.1 11/09/2018  Lab Results  Component Value Date   INSULIN 6.4 02/01/2022   Lab Results  Component Value Date   TSH 0.658 02/01/2022   Lab Results  Component Value Date   CHOL 215 (H) 02/01/2022   HDL 48 02/01/2022   LDLCALC 137 (H) 02/01/2022   TRIG 168 (H) 02/01/2022   CHOLHDL 2.8 11/09/2018   Lab Results  Component Value Date   VD25OH 36.6  02/01/2022   Lab Results  Component Value Date   WBC 8.9 09/08/2020   HGB 15.2 (H) 09/08/2020   HCT 47.0 (H) 09/08/2020   MCV 90.0 09/08/2020   PLT 224 09/08/2020   No results found for: "IRON", "TIBC", "FERRITIN"  Attestation Statements:   Reviewed by clinician on day of visit: allergies, medications, problem list, medical history, surgical history, family history, social history, and previous encounter notes.  I, Vanessa Sharp, RMA, am acting as Location manager for [employer], DO.  I have reviewed the above documentation for accuracy and completeness, and I agree with the above. Jearld Lesch, DO

## 2022-03-15 ENCOUNTER — Encounter (INDEPENDENT_AMBULATORY_CARE_PROVIDER_SITE_OTHER): Payer: Self-pay | Admitting: Bariatrics

## 2022-04-04 ENCOUNTER — Encounter (INDEPENDENT_AMBULATORY_CARE_PROVIDER_SITE_OTHER): Payer: Self-pay | Admitting: Bariatrics

## 2022-04-04 ENCOUNTER — Ambulatory Visit (INDEPENDENT_AMBULATORY_CARE_PROVIDER_SITE_OTHER): Payer: BC Managed Care – PPO | Admitting: Bariatrics

## 2022-04-04 VITALS — BP 105/72 | HR 95 | Temp 97.8°F | Ht 68.0 in | Wt 190.0 lb

## 2022-04-04 DIAGNOSIS — R632 Polyphagia: Secondary | ICD-10-CM

## 2022-04-04 DIAGNOSIS — E559 Vitamin D deficiency, unspecified: Secondary | ICD-10-CM | POA: Diagnosis not present

## 2022-04-04 DIAGNOSIS — E669 Obesity, unspecified: Secondary | ICD-10-CM | POA: Diagnosis not present

## 2022-04-04 DIAGNOSIS — Z6829 Body mass index (BMI) 29.0-29.9, adult: Secondary | ICD-10-CM | POA: Diagnosis not present

## 2022-04-04 DIAGNOSIS — Z7985 Long-term (current) use of injectable non-insulin antidiabetic drugs: Secondary | ICD-10-CM

## 2022-04-04 MED ORDER — WEGOVY 0.5 MG/0.5ML ~~LOC~~ SOAJ: 0.5000 mg | SUBCUTANEOUS | 0 refills | Status: DC

## 2022-04-04 MED ORDER — VITAMIN D (ERGOCALCIFEROL) 1.25 MG (50000 UNIT) PO CAPS: 50000.0000 [IU] | ORAL_CAPSULE | ORAL | 0 refills | Status: DC

## 2022-04-05 NOTE — Progress Notes (Signed)
Chief Complaint:   OBESITY Vanessa Sharp is here to discuss her progress with her obesity treatment plan along with follow-up of her obesity related diagnoses. Vanessa Sharp is on the Category 2 Plan or keeping a food journal and adhering to recommended goals of 1200 calories and 80 grams of protein daily and states she is following her eating plan approximately 100% of the time. Vanessa Sharp states she is walking for 60 minutes 2-3 times per week.  Today's visit was #: 4 Starting weight: 203 lbs Starting date: 02/01/2022 Today's weight: 190 lbs Today's date: 04/04/2022 Total lbs lost to date: 13 Total lbs lost since last in-office visit: 12  Interim History: Vanessa Sharp is down 12 pounds since her last visit.  She is doing well with her water and protein intake.  Subjective:   1. Polyphagia Vanessa Sharp is currently taking Wegovy 0.5 mg once weekly.  2. Vitamin D deficiency Vanessa Sharp is taking vitamin D prescription as directed.  Assessment/Plan:   1. Polyphagia [name] will continue Wegovy 0.5 mg once weekly, and we will refill for 1 month.  - Semaglutide-Weight Management (WEGOVY) 0.5 MG/0.5ML SOAJ; Inject 0.5 mg into the skin once a week.  Dispense: 2 mL; Refill: 0  2. Vitamin D deficiency Vanessa Sharp will continue prescription vitamin D 50,000 units once weekly, and we will refill for 90 days.  - Vitamin D, Ergocalciferol, (DRISDOL) 1.25 MG (50000 UNIT) CAPS capsule; Take 1 capsule (50,000 Units total) by mouth every 7 (seven) days.  Dispense: 12 capsule; Refill: 0  3. Obesity, Current BMI 29.0 Vanessa Sharp is currently in the action stage of change. As such, her goal is to continue with weight loss efforts. She has agreed to the Category 2 Plan.   Vanessa Sharp will continue to adhere closely to the plan 85-100%.  Meal planning was discussed.  Exercise goals: As is.  Behavioral modification strategies: increasing lean protein intake, decreasing simple carbohydrates, increasing vegetables, increasing water  intake, decreasing eating out, no skipping meals, meal planning and cooking strategies, keeping healthy foods in the home, and planning for success.  Vanessa Sharp has agreed to follow-up with our clinic in 2 to 3 weeks. She was informed of the importance of frequent follow-up visits to maximize her success with intensive lifestyle modifications for her multiple health conditions.   Objective:   Blood pressure 105/72, pulse 95, temperature 97.8 F (36.6 C), height '5\' 8"'$  (1.727 m), weight 190 lb (86.2 kg), last menstrual period 05/15/2017, SpO2 97 %. Body mass index is 28.89 kg/m.  General: Cooperative, alert, well developed, in no acute distress. HEENT: Conjunctivae and lids unremarkable. Cardiovascular: Regular rhythm.  Lungs: Normal work of breathing. Neurologic: No focal deficits.   Lab Results  Component Value Date   CREATININE 0.60 02/01/2022   BUN 9 02/01/2022   NA 141 02/01/2022   K 4.7 02/01/2022   CL 100 02/01/2022   CO2 27 02/01/2022   Lab Results  Component Value Date   ALT 17 02/01/2022   AST 15 02/01/2022   ALKPHOS 93 02/01/2022   BILITOT 0.6 02/01/2022   Lab Results  Component Value Date   HGBA1C 5.2 02/01/2022   HGBA1C 5.1 11/09/2018   Lab Results  Component Value Date   INSULIN 6.4 02/01/2022   Lab Results  Component Value Date   TSH 0.658 02/01/2022   Lab Results  Component Value Date   CHOL 215 (H) 02/01/2022   HDL 48 02/01/2022   LDLCALC 137 (H) 02/01/2022   TRIG 168 (H) 02/01/2022  CHOLHDL 2.8 11/09/2018   Lab Results  Component Value Date   VD25OH 36.6 02/01/2022   Lab Results  Component Value Date   WBC 8.9 09/08/2020   HGB 15.2 (H) 09/08/2020   HCT 47.0 (H) 09/08/2020   MCV 90.0 09/08/2020   PLT 224 09/08/2020   No results found for: "IRON", "TIBC", "FERRITIN"  Attestation Statements:   Reviewed by clinician on day of visit: allergies, medications, problem list, medical history, surgical history, family history, social history,  and previous encounter notes.   Wilhemena Durie, am acting as Location manager for [employer], DO.  I have reviewed the above documentation for accuracy and completeness, and I agree with the above. Jearld Lesch, DO

## 2022-04-06 ENCOUNTER — Encounter (INDEPENDENT_AMBULATORY_CARE_PROVIDER_SITE_OTHER): Payer: Self-pay | Admitting: Bariatrics

## 2022-05-04 ENCOUNTER — Encounter (INDEPENDENT_AMBULATORY_CARE_PROVIDER_SITE_OTHER): Payer: Self-pay

## 2022-05-09 ENCOUNTER — Ambulatory Visit (INDEPENDENT_AMBULATORY_CARE_PROVIDER_SITE_OTHER): Payer: BC Managed Care – PPO | Admitting: Bariatrics

## 2022-05-09 ENCOUNTER — Encounter (INDEPENDENT_AMBULATORY_CARE_PROVIDER_SITE_OTHER): Payer: Self-pay | Admitting: Bariatrics

## 2022-05-09 VITALS — BP 94/62 | HR 75 | Temp 97.9°F | Ht 68.0 in | Wt 183.0 lb

## 2022-05-09 DIAGNOSIS — R632 Polyphagia: Secondary | ICD-10-CM

## 2022-05-09 DIAGNOSIS — Z6830 Body mass index (BMI) 30.0-30.9, adult: Secondary | ICD-10-CM

## 2022-05-09 DIAGNOSIS — E559 Vitamin D deficiency, unspecified: Secondary | ICD-10-CM | POA: Insufficient documentation

## 2022-05-09 DIAGNOSIS — E669 Obesity, unspecified: Secondary | ICD-10-CM | POA: Insufficient documentation

## 2022-05-09 DIAGNOSIS — Z6827 Body mass index (BMI) 27.0-27.9, adult: Secondary | ICD-10-CM

## 2022-05-09 DIAGNOSIS — E7800 Pure hypercholesterolemia, unspecified: Secondary | ICD-10-CM

## 2022-05-09 MED ORDER — WEGOVY 0.5 MG/0.5ML ~~LOC~~ SOAJ: 0.5000 mg | SUBCUTANEOUS | 0 refills | Status: DC

## 2022-05-10 LAB — COMPREHENSIVE METABOLIC PANEL
ALT: 17 IU/L (ref 0–32)
AST: 14 IU/L (ref 0–40)
Albumin/Globulin Ratio: 2.1 (ref 1.2–2.2)
Albumin: 4.8 g/dL (ref 3.9–4.9)
Alkaline Phosphatase: 69 IU/L (ref 44–121)
BUN/Creatinine Ratio: 17 (ref 9–23)
BUN: 11 mg/dL (ref 6–24)
Bilirubin Total: 0.5 mg/dL (ref 0.0–1.2)
CO2: 21 mmol/L (ref 20–29)
Calcium: 9.3 mg/dL (ref 8.7–10.2)
Chloride: 104 mmol/L (ref 96–106)
Creatinine, Ser: 0.63 mg/dL (ref 0.57–1.00)
Globulin, Total: 2.3 g/dL (ref 1.5–4.5)
Glucose: 76 mg/dL (ref 70–99)
Potassium: 5 mmol/L (ref 3.5–5.2)
Sodium: 142 mmol/L (ref 134–144)
Total Protein: 7.1 g/dL (ref 6.0–8.5)
eGFR: 111 mL/min/{1.73_m2} (ref >59)

## 2022-05-10 LAB — VITAMIN D 25 HYDROXY (VIT D DEFICIENCY, FRACTURES): Vit D, 25-Hydroxy: 75.6 ng/mL (ref 30.0–100.0)

## 2022-05-10 LAB — LIPID PANEL WITH LDL/HDL RATIO
Cholesterol, Total: 131 mg/dL (ref 100–199)
HDL: 41 mg/dL (ref >39)
LDL Chol Calc (NIH): 66 mg/dL (ref 0–99)
LDL/HDL Ratio: 1.6 ratio (ref 0.0–3.2)
Triglycerides: 133 mg/dL (ref 0–149)
VLDL Cholesterol Cal: 24 mg/dL (ref 5–40)

## 2022-05-12 NOTE — Progress Notes (Signed)
Chief Complaint:   OBESITY Vanessa Sharp is here to discuss her progress with her obesity treatment plan along with follow-up of her obesity related diagnoses. Vanessa Sharp is on the Category 2 Plan and states she is following her eating plan approximately 99% of the time. Vanessa Sharp states she is walking for 30 minutes 5 times per week.  Today's visit was #: 5 Starting weight: 203 lbs Starting date: 02/01/2022 Today's weight: 183 lbs Today's date: 05/09/22 Total lbs lost to date: 20 Total lbs lost since last in-office visit: -7  Interim History: She is down another 7 pounds since her last visit.  She is doing well with water and protein.  Subjective:   1. Polyphagia Hunger well-controlled with [id number].  2. Vitamin D deficiency Taking vitamin D.  3. Elevated cholesterol Last labs 5/23.  Assessment/Plan:   1. Polyphagia 1. Refill - Semaglutide-Weight Management (WEGOVY) 0.5 MG/0.5ML SOAJ; Inject 0.5 mg into the skin once a week.  Dispense: 2 mL; Refill: 0 2.  Check Labs - Lipid Panel With LDL/HDL Ratio - Comprehensive metabolic panel  2. Vitamin D deficiency 1.  Continue vitamin D 2.  Check vitamin D level - VITAMIN D 25 Hydroxy (Vit-D Deficiency, Fractures)  3. Elevated cholesterol 1.  Check lipid panel. - Lipid Panel With LDL/HDL Ratio  4. Obesity, Current BMI 27.9 1.  Meal planning 2.  Intentional eating  Vanessa Sharp is currently in the action stage of change. As such, her goal is to continue with weight loss efforts. She has agreed to the Category 2 Plan.   Exercise goals: as is  Behavioral modification strategies: increasing lean protein intake, decreasing simple carbohydrates, increasing vegetables, increasing water intake, decreasing eating out, no skipping meals, meal planning and cooking strategies, and keeping healthy foods in the home.  Vanessa Sharp has agreed to follow-up with our clinic in 2-3 weeks with Dr. Valetta Close.  She was informed of the importance of frequent  follow-up visits to maximize her success with intensive lifestyle modifications for her multiple health conditions.   Vanessa Sharp was informed we would discuss her lab results at her next visit unless there is a critical issue that needs to be addressed sooner. Vanessa Sharp agreed to keep her next visit at the agreed upon time to discuss these results.  Objective:   Blood pressure 94/62, pulse 75, temperature 97.9 F (36.6 C), height '5\' 8"'$  (1.727 m), weight 183 lb (83 kg), last menstrual period 05/15/2017, SpO2 98 %. Body mass index is 27.83 kg/m.  General: Cooperative, alert, well developed, in no acute distress. HEENT: Conjunctivae and lids unremarkable. Cardiovascular: Regular rhythm.  Lungs: Normal work of breathing. Neurologic: No focal deficits.   Lab Results  Component Value Date   CREATININE 0.63 05/09/2022   BUN 11 05/09/2022   NA 142 05/09/2022   K 5.0 05/09/2022   CL 104 05/09/2022   CO2 21 05/09/2022   Lab Results  Component Value Date   ALT 17 05/09/2022   AST 14 05/09/2022   ALKPHOS 69 05/09/2022   BILITOT 0.5 05/09/2022   Lab Results  Component Value Date   HGBA1C 5.2 02/01/2022   HGBA1C 5.1 11/09/2018   Lab Results  Component Value Date   INSULIN 6.4 02/01/2022   Lab Results  Component Value Date   TSH 0.658 02/01/2022   Lab Results  Component Value Date   CHOL 131 05/09/2022   HDL 41 05/09/2022   LDLCALC 66 05/09/2022   TRIG 133 05/09/2022   CHOLHDL 2.8 11/09/2018  Lab Results  Component Value Date   VD25OH 75.6 05/09/2022   VD25OH 36.6 02/01/2022   Lab Results  Component Value Date   WBC 8.9 09/08/2020   HGB 15.2 (H) 09/08/2020   HCT 47.0 (H) 09/08/2020   MCV 90.0 09/08/2020   PLT 224 09/08/2020   No results found for: "IRON", "TIBC", "FERRITIN"  Attestation Statements:   Reviewed by clinician on day of visit: allergies, medications, problem list, medical history, surgical history, family history, social history, and previous encounter  notes.  I, Dawn Whitmire, FNP-C, am acting as transcriptionist for Dr. Jearld Lesch.  I have reviewed the above documentation for accuracy and completeness, and I agree with the above. Jearld Lesch, DO

## 2022-05-16 ENCOUNTER — Encounter (INDEPENDENT_AMBULATORY_CARE_PROVIDER_SITE_OTHER): Payer: Self-pay | Admitting: Bariatrics

## 2022-05-31 ENCOUNTER — Ambulatory Visit (INDEPENDENT_AMBULATORY_CARE_PROVIDER_SITE_OTHER): Payer: BC Managed Care – PPO | Admitting: Family Medicine

## 2022-05-31 ENCOUNTER — Other Ambulatory Visit (HOSPITAL_COMMUNITY): Payer: Self-pay

## 2022-05-31 ENCOUNTER — Encounter (INDEPENDENT_AMBULATORY_CARE_PROVIDER_SITE_OTHER): Payer: Self-pay | Admitting: Family Medicine

## 2022-05-31 VITALS — BP 104/69 | HR 97 | Temp 97.8°F | Ht 68.0 in | Wt 179.0 lb

## 2022-05-31 DIAGNOSIS — E669 Obesity, unspecified: Secondary | ICD-10-CM | POA: Diagnosis not present

## 2022-05-31 DIAGNOSIS — E7849 Other hyperlipidemia: Secondary | ICD-10-CM | POA: Diagnosis not present

## 2022-05-31 DIAGNOSIS — Z6827 Body mass index (BMI) 27.0-27.9, adult: Secondary | ICD-10-CM

## 2022-05-31 DIAGNOSIS — E559 Vitamin D deficiency, unspecified: Secondary | ICD-10-CM | POA: Diagnosis not present

## 2022-05-31 DIAGNOSIS — R748 Abnormal levels of other serum enzymes: Secondary | ICD-10-CM | POA: Diagnosis not present

## 2022-05-31 MED ORDER — WEGOVY 0.5 MG/0.5ML ~~LOC~~ SOAJ
0.5000 mg | SUBCUTANEOUS | 0 refills | Status: DC
  Filled 2022-05-31: qty 2, 28d supply, fill #0

## 2022-06-07 NOTE — Progress Notes (Signed)
Chief Complaint:   OBESITY Vanessa Sharp is here to discuss her progress with her obesity treatment plan along with follow-up of her obesity related diagnoses. Labrittany is on the Category 2 Plan and states she is following her eating plan approximately 100% of the time. [name] states she is not exercising.   Today's visit was #: 6 Starting weight: 203 lbs Starting date: 02/01/2022 Today's weight: 179 lbs Today's date: 05/31/2022 Total lbs lost to date: 24 lbs Total lbs lost since last in-office visit: 4 lbs  Interim History: Doing well with category 2 meal plan.  Happy to see 24 lbs of weight loss.  On Wegovy 0.5 mg weekly.  Mild GERD.  Plans to increase walking time.  Likes the satiety.  Not meal skipping.  Denies cravings.   Subjective:   1. Vitamin D deficiency Discussed labs with patient today. She is currently taking prescription vitamin D 50,000 IU each week. She denies nausea, vomiting or muscle weakness.  05/09/2022, Vitamin D level increased to 75.6.    2. Other hyperlipidemia Discussed labs with patient today. Improved.  Actively working on a healthy diet.   3. Low serum HDL Discussed labs with patient today. HDL <49, with weight loss lack of exercise.   Assessment/Plan:   1. Vitamin D deficiency Decrease Vitamin D to 50,000 IU every other week.   2. Other hyperlipidemia Continue healthy diet, keeping BMI <30.  3. Low serum HDL Increased healthy fats, 2 servings, day of Omega 3 rich foods.  4. Obesity, current BMI 27.2 Refill - Semaglutide-Weight Management (WEGOVY) 0.5 MG/0.5ML SOAJ; Inject 0.5 mg into the skin once a week.  Dispense: 2 mL; Refill: 0  [name] is currently in the action stage of change. As such, her goal is to continue with weight loss efforts. She has agreed to the Category 2 Plan.   Exercise goals:  Track steps, walk at least 30 minutes 4 times per week.   Behavioral modification strategies: increasing lean protein intake, increasing  vegetables, increasing water intake, decreasing eating out, no skipping meals, keeping healthy foods in the home, and better snacking choices.  [name] has agreed to follow-up with our clinic in 3 weeks. She was informed of the importance of frequent follow-up visits to maximize her success with intensive lifestyle modifications for her multiple health conditions.   Objective:   Blood pressure 104/69, pulse 97, temperature 97.8 F (36.6 C), height '5\' 8"'$  (1.727 m), weight 179 lb (81.2 kg), last menstrual period 05/15/2017, SpO2 96 %. Body mass index is 27.22 kg/m.  General: Cooperative, alert, well developed, in no acute distress. HEENT: Conjunctivae and lids unremarkable. Cardiovascular: Regular rhythm.  Lungs: Normal work of breathing. Neurologic: No focal deficits.   Lab Results  Component Value Date   CREATININE 0.63 05/09/2022   BUN 11 05/09/2022   NA 142 05/09/2022   K 5.0 05/09/2022   CL 104 05/09/2022   CO2 21 05/09/2022   Lab Results  Component Value Date   ALT 17 05/09/2022   AST 14 05/09/2022   ALKPHOS 69 05/09/2022   BILITOT 0.5 05/09/2022   Lab Results  Component Value Date   HGBA1C 5.2 02/01/2022   HGBA1C 5.1 11/09/2018   Lab Results  Component Value Date   INSULIN 6.4 02/01/2022   Lab Results  Component Value Date   TSH 0.658 02/01/2022   Lab Results  Component Value Date   CHOL 131 05/09/2022   HDL 41 05/09/2022   LDLCALC 66 05/09/2022  TRIG 133 05/09/2022   CHOLHDL 2.8 11/09/2018   Lab Results  Component Value Date   VD25OH 75.6 05/09/2022   VD25OH 36.6 02/01/2022   Lab Results  Component Value Date   WBC 8.9 09/08/2020   HGB 15.2 (H) 09/08/2020   HCT 47.0 (H) 09/08/2020   MCV 90.0 09/08/2020   PLT 224 09/08/2020   No results found for: "IRON", "TIBC", "FERRITIN"  Attestation Statements:   Reviewed by clinician on day of visit: allergies, medications, problem list, medical history, surgical history, family history, social  history, and previous encounter notes.  I, Davy Pique, am acting as Location manager for Loyal Gambler, DO.  I have reviewed the above documentation for accuracy and completeness, and I agree with the above. Dell Ponto, DO

## 2022-06-28 ENCOUNTER — Encounter (INDEPENDENT_AMBULATORY_CARE_PROVIDER_SITE_OTHER): Payer: Self-pay | Admitting: Family Medicine

## 2022-06-28 ENCOUNTER — Other Ambulatory Visit (HOSPITAL_COMMUNITY): Payer: Self-pay

## 2022-06-28 ENCOUNTER — Ambulatory Visit (INDEPENDENT_AMBULATORY_CARE_PROVIDER_SITE_OTHER): Payer: BC Managed Care – PPO | Admitting: Family Medicine

## 2022-06-28 VITALS — BP 108/73 | HR 84 | Temp 97.8°F | Ht 68.0 in | Wt 181.0 lb

## 2022-06-28 DIAGNOSIS — E669 Obesity, unspecified: Secondary | ICD-10-CM

## 2022-06-28 DIAGNOSIS — N200 Calculus of kidney: Secondary | ICD-10-CM

## 2022-06-28 DIAGNOSIS — R632 Polyphagia: Secondary | ICD-10-CM | POA: Diagnosis not present

## 2022-06-28 DIAGNOSIS — E559 Vitamin D deficiency, unspecified: Secondary | ICD-10-CM

## 2022-06-28 DIAGNOSIS — Z6827 Body mass index (BMI) 27.0-27.9, adult: Secondary | ICD-10-CM

## 2022-06-28 MED ORDER — WEGOVY 0.5 MG/0.5ML ~~LOC~~ SOAJ
0.5000 mg | SUBCUTANEOUS | 0 refills | Status: DC
  Filled 2022-06-28: qty 2, 28d supply, fill #0

## 2022-06-29 DIAGNOSIS — N200 Calculus of kidney: Secondary | ICD-10-CM | POA: Insufficient documentation

## 2022-07-06 NOTE — Progress Notes (Signed)
Chief Complaint:   OBESITY Vanessa Sharp is here to discuss her progress with her obesity treatment plan along with follow-up of her obesity related diagnoses. [name] is on the Category 2 Plan and states she is following her eating plan approximately 95% of the time. [name] states she is not exercising.    Today's visit was #: 7 Starting weight: 203 lbs Starting date: 02/01/2022 Today's weight: 181 lbs Today's date: 06/28/2022 Total lbs lost to date: 22 lbs Total lbs lost since last in-office visit: +2 lbs  Interim History: She has had (asymptomatic) kidney stones. She is seeing [facility] for cystoscopy and MRI.  She has been hydrating well with water.  She has a positive family history of kidney stones.  She is on Wegovy 0.5 mg weekly injection. Last injection 05/23/2022, she lost 2 pens.  Has seen slight increase in hunger being off Wegovy.   Subjective:   1. Polyphagia She has been improving on Wegovy.  She has increased intake of protein and fiber.    2. Vitamin D deficiency Changed Vitamin D prescription to 50,000 IU to every other week.  Last vitamin D level in August 2023 was 75.6.  3. Nephrolithiasis She has passed several small stones in the last 2 weeks without difficulty.  Has follow with urology for further imaging. She has increased water intake to >64 oz per day.    Assessment/Plan:   1. Polyphagia Refilled Wegovy 0.5 mg once weekly.   2. Vitamin D deficiency Continue prescription Vitamin D level every other week and recheck level in December of 2023.  3. Nephrolithiasis Avoid use of Topiramate, (has never used).   4. Obesity, current BMI 27.6 Refill - Semaglutide-Weight Management (WEGOVY) 0.5 MG/0.5ML SOAJ; Inject 0.5 mg into the skin once a week.  Dispense: 2 mL; Refill: 0  [name] is currently in the action stage of change. As such, her goal is to continue with weight loss efforts. She has agreed to the Category 2 Plan.   Exercise goals:  Discussed  dancing, walking etc., 3-4 per week.   Behavioral modification strategies: increasing lean protein intake, increasing vegetables, increasing water intake, decreasing liquid calories, decreasing eating out, no skipping meals, meal planning and cooking strategies, keeping healthy foods in the home, better snacking choices, planning for success, and decreasing junk food.  [name] has agreed to follow-up with our clinic in 3-4 weeks. She was informed of the importance of frequent follow-up visits to maximize her success with intensive lifestyle modifications for her multiple health conditions.   Objective:   Blood pressure 108/73, pulse 84, temperature 97.8 F (36.6 C), height '5\' 8"'$  (1.727 m), weight 181 lb (82.1 kg), last menstrual period 05/15/2017, SpO2 98 %. Body mass index is 27.52 kg/m.  General: Cooperative, alert, well developed, in no acute distress. HEENT: Conjunctivae and lids unremarkable. Cardiovascular: Regular rhythm.  Lungs: Normal work of breathing. Neurologic: No focal deficits.   Lab Results  Component Value Date   CREATININE 0.63 05/09/2022   BUN 11 05/09/2022   NA 142 05/09/2022   K 5.0 05/09/2022   CL 104 05/09/2022   CO2 21 05/09/2022   Lab Results  Component Value Date   ALT 17 05/09/2022   AST 14 05/09/2022   ALKPHOS 69 05/09/2022   BILITOT 0.5 05/09/2022   Lab Results  Component Value Date   HGBA1C 5.2 02/01/2022   HGBA1C 5.1 11/09/2018   Lab Results  Component Value Date   INSULIN 6.4 02/01/2022   Lab  Results  Component Value Date   TSH 0.658 02/01/2022   Lab Results  Component Value Date   CHOL 131 05/09/2022   HDL 41 05/09/2022   LDLCALC 66 05/09/2022   TRIG 133 05/09/2022   CHOLHDL 2.8 11/09/2018   Lab Results  Component Value Date   VD25OH 75.6 05/09/2022   VD25OH 36.6 02/01/2022   Lab Results  Component Value Date   WBC 8.9 09/08/2020   HGB 15.2 (H) 09/08/2020   HCT 47.0 (H) 09/08/2020   MCV 90.0 09/08/2020   PLT 224  09/08/2020   No results found for: "IRON", "TIBC", "FERRITIN"  Attestation Statements:   Reviewed by clinician on day of visit: allergies, medications, problem list, medical history, surgical history, family history, social history, and previous encounter notes.  I, Davy Pique, am acting as Location manager for Loyal Gambler, DO.  I have reviewed the above documentation for accuracy and completeness, and I agree with the above. Dell Ponto, DO

## 2022-07-13 ENCOUNTER — Other Ambulatory Visit (HOSPITAL_COMMUNITY): Payer: Self-pay

## 2022-07-27 ENCOUNTER — Ambulatory Visit (INDEPENDENT_AMBULATORY_CARE_PROVIDER_SITE_OTHER): Payer: BC Managed Care – PPO | Admitting: Family Medicine

## 2022-07-27 ENCOUNTER — Encounter (INDEPENDENT_AMBULATORY_CARE_PROVIDER_SITE_OTHER): Payer: Self-pay | Admitting: Family Medicine

## 2022-07-27 ENCOUNTER — Other Ambulatory Visit (HOSPITAL_COMMUNITY): Payer: Self-pay

## 2022-07-27 VITALS — BP 115/75 | HR 77 | Temp 98.0°F | Ht 68.0 in | Wt 181.0 lb

## 2022-07-27 DIAGNOSIS — E669 Obesity, unspecified: Secondary | ICD-10-CM

## 2022-07-27 DIAGNOSIS — Z6827 Body mass index (BMI) 27.0-27.9, adult: Secondary | ICD-10-CM

## 2022-07-27 DIAGNOSIS — R632 Polyphagia: Secondary | ICD-10-CM

## 2022-07-27 DIAGNOSIS — E559 Vitamin D deficiency, unspecified: Secondary | ICD-10-CM | POA: Diagnosis not present

## 2022-07-27 MED ORDER — WEGOVY 0.25 MG/0.5ML ~~LOC~~ SOAJ
0.2500 mg | SUBCUTANEOUS | 0 refills | Status: DC
  Filled 2022-07-27: qty 2, 28d supply, fill #0

## 2022-08-09 ENCOUNTER — Other Ambulatory Visit (INDEPENDENT_AMBULATORY_CARE_PROVIDER_SITE_OTHER): Payer: Self-pay | Admitting: Bariatrics

## 2022-08-09 ENCOUNTER — Encounter (INDEPENDENT_AMBULATORY_CARE_PROVIDER_SITE_OTHER): Payer: Self-pay

## 2022-08-09 DIAGNOSIS — E559 Vitamin D deficiency, unspecified: Secondary | ICD-10-CM

## 2022-08-09 NOTE — Progress Notes (Signed)
Chief Complaint:   OBESITY Vanessa Sharp is here to discuss her progress with her obesity treatment plan along with follow-up of her obesity related diagnoses. Vanessa Sharp is on the Category 2 Plan and states she is following her eating plan approximately 954% of the time. Vanessa Sharp states she is not exercising.   Today's visit was #: 8 Starting weight: 203 lbs Starting date: 02/01/2022 Today's weight: 181 lbs Today's date: 07/27/2022 Total lbs lost to date: 22 lbs Total lbs lost since last in-office visit: 0  Interim History: She has passed kidney stones since last visit.  She is hydrating well.  She is seeing [facility].  She had a cystoscopy done, and is having a MRI today.  She restarted Wegovy 0.5 mg 3 weeks ago and is having diarrhea.  Diarrhea is lasting 4 days.   Subjective:   1. Polyphagia Has had diarrhea with Wegovy 0.5 mg dose.  Concerns for dehydration with recent nephrolithiasis.   2. Vitamin D deficiency She is currently taking prescription vitamin D 50,000 IU each week. She denies nausea, vomiting or muscle weakness. Last Vitamin D level 75.6.   Assessment/Plan:   1. Polyphagia Wegovy dose reduced to 0.25 mg weekly.  Increase water intake to 90 oz per day.   2. Vitamin D deficiency Recheck Vitamin D level in 3 months.   3. Obesity,current BMI 27.6 Hold Wegovy 0.5 mg due to risk of dehydration with current kidney stones.  De-escalate back down to Wegovy 0.25 mg dose.   Refill - Semaglutide-Weight Management (WEGOVY) 0.25 MG/0.5ML SOAJ; Inject 0.25 mg into the skin once a week.  Dispense: 2 mL; Refill: 0  Vanessa Sharp is currently in the action stage of change. As such, her goal is to continue with weight loss efforts. She has agreed to the Category 2 Plan+80 protein daily.   Exercise goals:  Plans to try out dance classes online.   Behavioral modification strategies: increasing lean protein intake, increasing vegetables, increasing water intake, decreasing eating out,  no skipping meals, meal planning and cooking strategies, keeping healthy foods in the home, and decreasing junk food.  Vanessa Sharp has agreed to follow-up with our clinic in 3 weeks. She was informed of the importance of frequent follow-up visits to maximize her success with intensive lifestyle modifications for her multiple health conditions.   Objective:   Blood pressure 115/75, pulse 77, temperature 98 F (36.7 C), height '5\' 8"'$  (1.727 m), weight 181 lb (82.1 kg), last menstrual period 05/15/2017, SpO2 99 %. Body mass index is 27.52 kg/m.  General: Cooperative, alert, well developed, in no acute distress. HEENT: Conjunctivae and lids unremarkable. Cardiovascular: Regular rhythm.  Lungs: Normal work of breathing. Neurologic: No focal deficits.   Lab Results  Component Value Date   CREATININE 0.63 05/09/2022   BUN 11 05/09/2022   NA 142 05/09/2022   K 5.0 05/09/2022   CL 104 05/09/2022   CO2 21 05/09/2022   Lab Results  Component Value Date   ALT 17 05/09/2022   AST 14 05/09/2022   ALKPHOS 69 05/09/2022   BILITOT 0.5 05/09/2022   Lab Results  Component Value Date   HGBA1C 5.2 02/01/2022   HGBA1C 5.1 11/09/2018   Lab Results  Component Value Date   INSULIN 6.4 02/01/2022   Lab Results  Component Value Date   TSH 0.658 02/01/2022   Lab Results  Component Value Date   CHOL 131 05/09/2022   HDL 41 05/09/2022   LDLCALC 66 05/09/2022   TRIG 133  05/09/2022   CHOLHDL 2.8 11/09/2018   Lab Results  Component Value Date   VD25OH 75.6 05/09/2022   VD25OH 36.6 02/01/2022   Lab Results  Component Value Date   WBC 8.9 09/08/2020   HGB 15.2 (H) 09/08/2020   HCT 47.0 (H) 09/08/2020   MCV 90.0 09/08/2020   PLT 224 09/08/2020   No results found for: "IRON", "TIBC", "FERRITIN"  Attestation Statements:   Reviewed by clinician on day of visit: allergies, medications, problem list, medical history, surgical history, family history, social history, and previous encounter  notes.  I, Davy Pique, am acting as Location manager for Loyal Gambler, DO.  I have reviewed the above documentation for accuracy and completeness, and I agree with the above. Dell Ponto, DO

## 2022-08-23 ENCOUNTER — Ambulatory Visit (INDEPENDENT_AMBULATORY_CARE_PROVIDER_SITE_OTHER): Payer: BC Managed Care – PPO | Admitting: Family Medicine

## 2022-08-25 ENCOUNTER — Ambulatory Visit: Payer: BC Managed Care – PPO | Admitting: Bariatrics

## 2022-08-25 ENCOUNTER — Encounter: Payer: Self-pay | Admitting: Bariatrics

## 2022-08-25 VITALS — BP 114/74 | HR 78 | Temp 98.3°F | Ht 68.0 in | Wt 182.0 lb

## 2022-08-25 DIAGNOSIS — R632 Polyphagia: Secondary | ICD-10-CM

## 2022-08-25 DIAGNOSIS — E669 Obesity, unspecified: Secondary | ICD-10-CM

## 2022-08-25 DIAGNOSIS — Z6827 Body mass index (BMI) 27.0-27.9, adult: Secondary | ICD-10-CM | POA: Diagnosis not present

## 2022-08-25 DIAGNOSIS — N200 Calculus of kidney: Secondary | ICD-10-CM | POA: Diagnosis not present

## 2022-09-07 NOTE — Progress Notes (Signed)
Chief Complaint:   OBESITY Vanessa Sharp is here to discuss her progress with her obesity treatment plan along with follow-up of her obesity related diagnoses. Vanessa Sharp is on the Category 2 Plan and states she is following her eating plan approximately 95% of the time. Vanessa Sharp states she is doing 0 minutes 0 times per week.  Today's visit was #: 9 Starting weight: 203 lbs Starting date: 02/01/2022 Today's weight: 182 lbs Today's date: 08/25/2022 Total lbs lost to date: 21 Total lbs lost since last in-office visit: 0  Interim History: Vanessa Sharp is up 1 lb since her last visit.   Subjective:   1. Polyphagia Vanessa Sharp was prescribed the [facility] and she had diarrhea.   2. Kidney stones Vanessa Sharp has a history of family. She had 1 episode (15X), and denies pain. She had a cystoscopy.   Assessment/Plan:   1. Polyphagia [name] will work on increasing protein and fiber.   2. Kidney stones Will continue to follow over time.   3. Obesity,current BMI 27.7 Vanessa Sharp is currently in the action stage of change. As such, her goal is to continue with weight loss efforts. She has agreed to the Category 2 Plan with 80 grams of protein.   Meal planning and intentional eating were discussed.   Exercise goals: No exercise has been prescribed at this time.  Behavioral modification strategies: increasing lean protein intake, decreasing simple carbohydrates, increasing vegetables, increasing water intake, decreasing eating out, no skipping meals, meal planning and cooking strategies, keeping healthy foods in the home, and planning for success.  Vanessa Sharp has agreed to follow-up with our clinic in 3 to 4 weeks. She was informed of the importance of frequent follow-up visits to maximize her success with intensive lifestyle modifications for her multiple health conditions.   Objective:   Blood pressure 114/74, pulse 78, temperature 98.3 F (36.8 C), height '5\' 8"'$  (1.727 m), weight 182 lb (82.6 kg), last  menstrual period 05/15/2017, SpO2 96 %. Body mass index is 27.67 kg/m.  General: Cooperative, alert, well developed, in no acute distress. HEENT: Conjunctivae and lids unremarkable. Cardiovascular: Regular rhythm.  Lungs: Normal work of breathing. Neurologic: No focal deficits.   Lab Results  Component Value Date   CREATININE 0.63 05/09/2022   BUN 11 05/09/2022   NA 142 05/09/2022   K 5.0 05/09/2022   CL 104 05/09/2022   CO2 21 05/09/2022   Lab Results  Component Value Date   ALT 17 05/09/2022   AST 14 05/09/2022   ALKPHOS 69 05/09/2022   BILITOT 0.5 05/09/2022   Lab Results  Component Value Date   HGBA1C 5.2 02/01/2022   HGBA1C 5.1 11/09/2018   Lab Results  Component Value Date   INSULIN 6.4 02/01/2022   Lab Results  Component Value Date   TSH 0.658 02/01/2022   Lab Results  Component Value Date   CHOL 131 05/09/2022   HDL 41 05/09/2022   LDLCALC 66 05/09/2022   TRIG 133 05/09/2022   CHOLHDL 2.8 11/09/2018   Lab Results  Component Value Date   VD25OH 75.6 05/09/2022   VD25OH 36.6 02/01/2022   Lab Results  Component Value Date   WBC 8.9 09/08/2020   HGB 15.2 (H) 09/08/2020   HCT 47.0 (H) 09/08/2020   MCV 90.0 09/08/2020   PLT 224 09/08/2020   No results found for: "IRON", "TIBC", "FERRITIN"  Attestation Statements:   Reviewed by clinician on day of visit: allergies, medications, problem list, medical history, surgical history, family history, social history,  and previous encounter notes.   Wilhemena Durie, am acting as Location manager for [employer], DO.  I have reviewed the above documentation for accuracy and completeness, and I agree with the above. Jearld Lesch, DO

## 2022-09-14 ENCOUNTER — Ambulatory Visit: Payer: BC Managed Care – PPO | Admitting: Bariatrics

## 2022-10-04 ENCOUNTER — Ambulatory Visit: Payer: BC Managed Care – PPO | Admitting: Bariatrics

## 2022-10-04 ENCOUNTER — Encounter: Payer: Self-pay | Admitting: Bariatrics

## 2022-10-04 VITALS — BP 121/85 | HR 84 | Temp 98.4°F | Ht 68.0 in | Wt 190.0 lb

## 2022-10-04 DIAGNOSIS — R632 Polyphagia: Secondary | ICD-10-CM | POA: Diagnosis not present

## 2022-10-04 DIAGNOSIS — E559 Vitamin D deficiency, unspecified: Secondary | ICD-10-CM | POA: Diagnosis not present

## 2022-10-04 DIAGNOSIS — E669 Obesity, unspecified: Secondary | ICD-10-CM

## 2022-10-04 DIAGNOSIS — Z6829 Body mass index (BMI) 29.0-29.9, adult: Secondary | ICD-10-CM | POA: Diagnosis not present

## 2022-10-04 MED ORDER — VITAMIN D (ERGOCALCIFEROL) 1.25 MG (50000 UNIT) PO CAPS: 50000.0000 [IU] | ORAL_CAPSULE | ORAL | 0 refills | Status: DC

## 2022-10-04 MED ORDER — WEGOVY 0.25 MG/0.5ML ~~LOC~~ SOAJ: 0.2500 mg | SUBCUTANEOUS | 0 refills | Status: DC

## 2022-10-10 ENCOUNTER — Other Ambulatory Visit (HOSPITAL_COMMUNITY): Payer: Self-pay

## 2022-10-14 NOTE — Progress Notes (Signed)
Chief Complaint:   OBESITY Vanessa Sharp is here to discuss her progress with her obesity treatment plan along with follow-up of her obesity related diagnoses. Vanessa Sharp is on the Category 2 Plan and states she is following her eating plan approximately 80% of the time. Vanessa Sharp states she is not currently exercising.  Today's visit was #: 10 Starting weight: 203 lbs Starting date: 02/01/2022 Today's weight: 190 lbs Today's date: 10/04/2022 Total lbs lost to date: 13 Total lbs lost since last in-office visit: +8  Interim History: Vanessa Sharp is up 8 lbs since her last visit, which was over the holidays. She is up 3 lbs of water since her last visit.  Subjective:   1. Vitamin D deficiency Vanessa Sharp is taking Ergocalciferol as directed.  2. Polyphagia She continued [facility] in November 2023.  Assessment/Plan:   1. Vitamin D deficiency Low Vitamin D level contributes to fatigue and are associated with obesity, breast, and colon cancer. She agrees to continue to take prescription Vitamin D 50,000 IU every week and will follow-up for routine testing of Vitamin D, at least 2-3 times per year to avoid over-replacement.  Refill- Vitamin D, Ergocalciferol, (DRISDOL) 1.25 MG (50000 UNIT) CAPS capsule; Take 1 capsule (50,000 Units total) by mouth every 7 (seven) days.  Dispense: 12 capsule; Refill: 0  2. Polyphagia Continue current treatment plan.  Refill- Semaglutide-Weight Management (WEGOVY) 0.25 MG/0.5ML SOAJ; Inject 0.25 mg into the skin once a week.  Dispense: 2 mL; Refill: 0  3. Obesity,current BMI 29.0 Vanessa Sharp is currently in the action stage of change. As such, her goal is to continue with weight loss efforts. She has agreed to the Category 2 Plan.   Meal planning Increase water intake.  No added salt Increase fruits and vegetables  Exercise goals:  As is  Behavioral modification strategies: increasing lean protein intake, decreasing simple carbohydrates, increasing vegetables,  increasing water intake, decreasing eating out, no skipping meals, meal planning and cooking strategies, keeping healthy foods in the home, and planning for success.  Vanessa Sharp has agreed to follow-up with our clinic in 4 weeks, fasting for labs. She was informed of the importance of frequent follow-up visits to maximize her success with intensive lifestyle modifications for her multiple health conditions.   Objective:   Blood pressure 121/85, pulse 84, temperature 98.4 F (36.9 C), height '5\' 8"'$  (1.727 m), weight 190 lb (86.2 kg), last menstrual period 05/15/2017, SpO2 99 %. Body mass index is 28.89 kg/m.  General: Cooperative, alert, well developed, in no acute distress. HEENT: Conjunctivae and lids unremarkable. Cardiovascular: Regular rhythm.  Lungs: Normal work of breathing. Neurologic: No focal deficits.   Lab Results  Component Value Date   CREATININE 0.63 05/09/2022   BUN 11 05/09/2022   NA 142 05/09/2022   K 5.0 05/09/2022   CL 104 05/09/2022   CO2 21 05/09/2022   Lab Results  Component Value Date   ALT 17 05/09/2022   AST 14 05/09/2022   ALKPHOS 69 05/09/2022   BILITOT 0.5 05/09/2022   Lab Results  Component Value Date   HGBA1C 5.2 02/01/2022   HGBA1C 5.1 11/09/2018   Lab Results  Component Value Date   INSULIN 6.4 02/01/2022   Lab Results  Component Value Date   TSH 0.658 02/01/2022   Lab Results  Component Value Date   CHOL 131 05/09/2022   HDL 41 05/09/2022   LDLCALC 66 05/09/2022   TRIG 133 05/09/2022   CHOLHDL 2.8 11/09/2018   Lab Results  Component  Value Date   VD25OH 75.6 05/09/2022   VD25OH 36.6 02/01/2022   Lab Results  Component Value Date   WBC 8.9 09/08/2020   HGB 15.2 (H) 09/08/2020   HCT 47.0 (H) 09/08/2020   MCV 90.0 09/08/2020   PLT 224 09/08/2020   Attestation Statements:   Reviewed by clinician on day of visit: allergies, medications, problem list, medical history, surgical history, family history, social history, and  previous encounter notes.  I, Kathlene November, BS, CMA, am acting as transcriptionist for [employer], DO.  I have reviewed the above documentation for accuracy and completeness, and I agree with the above. Jearld Lesch, DO

## 2022-10-17 ENCOUNTER — Encounter: Payer: Self-pay | Admitting: Bariatrics

## 2022-10-24 ENCOUNTER — Other Ambulatory Visit: Payer: Self-pay | Admitting: Family Medicine

## 2022-10-24 DIAGNOSIS — Z1231 Encounter for screening mammogram for malignant neoplasm of breast: Secondary | ICD-10-CM

## 2022-10-31 ENCOUNTER — Ambulatory Visit
Admission: RE | Admit: 2022-10-31 | Discharge: 2022-10-31 | Disposition: A | Discharge status: Home / Self Care | Payer: BC Managed Care – PPO | Source: Ambulatory Visit

## 2022-10-31 DIAGNOSIS — Z1231 Encounter for screening mammogram for malignant neoplasm of breast: Secondary | ICD-10-CM

## 2022-11-02 ENCOUNTER — Ambulatory Visit: Payer: BC Managed Care – PPO | Admitting: Bariatrics

## 2022-11-02 ENCOUNTER — Encounter: Payer: Self-pay | Admitting: Bariatrics

## 2022-11-02 VITALS — BP 113/75 | HR 81 | Temp 98.1°F | Ht 68.0 in | Wt 187.0 lb

## 2022-11-02 DIAGNOSIS — E559 Vitamin D deficiency, unspecified: Secondary | ICD-10-CM

## 2022-11-02 DIAGNOSIS — R7309 Other abnormal glucose: Secondary | ICD-10-CM

## 2022-11-02 DIAGNOSIS — Z6828 Body mass index (BMI) 28.0-28.9, adult: Secondary | ICD-10-CM

## 2022-11-02 DIAGNOSIS — E7849 Other hyperlipidemia: Secondary | ICD-10-CM | POA: Diagnosis not present

## 2022-11-02 DIAGNOSIS — R748 Abnormal levels of other serum enzymes: Secondary | ICD-10-CM | POA: Diagnosis not present

## 2022-11-02 DIAGNOSIS — E669 Obesity, unspecified: Secondary | ICD-10-CM

## 2022-11-02 DIAGNOSIS — Z8673 Personal history of transient ischemic attack (TIA), and cerebral infarction without residual deficits: Secondary | ICD-10-CM

## 2022-11-03 ENCOUNTER — Telehealth (HOSPITAL_COMMUNITY): Payer: Self-pay

## 2022-11-03 LAB — COMPREHENSIVE METABOLIC PANEL
ALT: 12 IU/L (ref 0–32)
AST: 13 IU/L (ref 0–40)
Albumin/Globulin Ratio: 2 (ref 1.2–2.2)
Albumin: 4.7 g/dL (ref 3.9–4.9)
Alkaline Phosphatase: 79 IU/L (ref 44–121)
BUN/Creatinine Ratio: 13 (ref 9–23)
BUN: 9 mg/dL (ref 6–24)
Bilirubin Total: 0.7 mg/dL (ref 0.0–1.2)
CO2: 24 mmol/L (ref 20–29)
Calcium: 9.5 mg/dL (ref 8.7–10.2)
Chloride: 101 mmol/L (ref 96–106)
Creatinine, Ser: 0.71 mg/dL (ref 0.57–1.00)
Globulin, Total: 2.4 g/dL (ref 1.5–4.5)
Glucose: 80 mg/dL (ref 70–99)
Potassium: 4.6 mmol/L (ref 3.5–5.2)
Sodium: 141 mmol/L (ref 134–144)
Total Protein: 7.1 g/dL (ref 6.0–8.5)
eGFR: 107 mL/min/{1.73_m2} (ref >59)

## 2022-11-03 LAB — LIPID PANEL WITH LDL/HDL RATIO
Cholesterol, Total: 208 mg/dL — ABNORMAL HIGH (ref 100–199)
HDL: 50 mg/dL (ref >39)
LDL Chol Calc (NIH): 131 mg/dL — ABNORMAL HIGH (ref 0–99)
LDL/HDL Ratio: 2.6 ratio (ref 0.0–3.2)
Triglycerides: 150 mg/dL — ABNORMAL HIGH (ref 0–149)
VLDL Cholesterol Cal: 27 mg/dL (ref 5–40)

## 2022-11-03 LAB — INSULIN, RANDOM: INSULIN: 8.1 u[IU]/mL (ref 2.6–24.9)

## 2022-11-03 LAB — VITAMIN D 25 HYDROXY (VIT D DEFICIENCY, FRACTURES): Vit D, 25-Hydroxy: 43.8 ng/mL (ref 30.0–100.0)

## 2022-11-03 NOTE — Telephone Encounter (Signed)
Called to schedule mra, no answer, left vm. AB  

## 2022-11-16 NOTE — Progress Notes (Unsigned)
Chief Complaint:   OBESITY Vanessa Sharp is here to discuss her progress with her obesity treatment plan along with follow-up of her obesity related diagnoses. Vanessa Sharp is on the Category 2 plan and states she is following her eating plan approximately 95% of the time. Vanessa Sharp states she has not been exercising.  Today's visit was #: 11 Starting weight: 203 lbs Starting date: 02/01/22 Today's weight: 187 lbs Today's date: 11/02/22 Total lbs lost to date: 16 Total lbs lost since last in-office visit: -3  Interim History: She is down an additional 3 pounds and is doing well overall.  Subjective:   1. Vitamin D deficiency Taking vitamin D.  2. Other hyperlipidemia Not on medication.  3. Low serum HDL Not on medication.  4. History of ischemic stroke Taking aspirin daily.  5. Elevated glucose Last A1c within normal limits.   Assessment/Plan:   1. Vitamin D deficiency Check vitamin D level. - VITAMIN D 25 Hydroxy (Vit-D Deficiency, Fractures)  2. Other hyperlipidemia Check lipid panel. - Comprehensive metabolic panel - Lipid Panel With LDL/HDL Ratio  3. Low serum HDL Check lipid panel. - Lipid Panel With LDL/HDL Ratio  4. History of ischemic stroke Check CMP. - Comprehensive metabolic panel - Insulin, random - Lipid Panel With LDL/HDL Ratio - VITAMIN D 25 Hydroxy (Vit-D Deficiency, Fractures)  5. Elevated glucose Check fasting insulin. - Insulin, random  6. Generalized obesity, BMI 28.0-28.9,adult 1.  Meal planning 2.  Will continue to follow the plan closely 90-95%.  Vanessa Sharp is currently in the action stage of change. As such, her goal is to continue with weight loss efforts. She has agreed to the Category 2 plan.  Exercise goals: All adults should avoid inactivity. Some physical activity is better than none, and adults who participate in any amount of physical activity gain some health benefits.  Behavioral modification strategies: increasing lean protein  intake, decreasing simple carbohydrates, increasing vegetables, increasing water intake, decreasing eating out, no skipping meals, meal planning and cooking strategies, keeping healthy foods in the home, dealing with family or coworker sabotage, travel eating strategies, holiday eating strategies , and celebration eating strategies.  Vanessa Sharp has agreed to follow-up with our clinic in 4 weeks. She was informed of the importance of frequent follow-up visits to maximize her success with intensive lifestyle modifications for her multiple health conditions.   Vanessa Sharp was informed we would discuss her lab results at her next visit unless there is a critical issue that needs to be addressed sooner. Vanessa Sharp agreed to keep her next visit at the agreed upon time to discuss these results.  Objective:   Blood pressure 113/75, pulse 81, temperature 98.1 F (36.7 C), height 5' 8"$  (1.727 m), weight 187 lb (84.8 kg), last menstrual period 05/15/2017, SpO2 98 %. Body mass index is 28.43 kg/m.  General: Cooperative, alert, well developed, in no acute distress. HEENT: Conjunctivae and lids unremarkable. Cardiovascular: Regular rhythm.  Lungs: Normal work of breathing. Neurologic: No focal deficits.   Lab Results  Component Value Date   CREATININE 0.71 11/02/2022   BUN 9 11/02/2022   NA 141 11/02/2022   K 4.6 11/02/2022   CL 101 11/02/2022   CO2 24 11/02/2022   Lab Results  Component Value Date   ALT 12 11/02/2022   AST 13 11/02/2022   ALKPHOS 79 11/02/2022   BILITOT 0.7 11/02/2022   Lab Results  Component Value Date   HGBA1C 5.2 02/01/2022   HGBA1C 5.1 11/09/2018   Lab Results  Component Value Date   INSULIN 8.1 11/02/2022   INSULIN 6.4 02/01/2022   Lab Results  Component Value Date   TSH 0.658 02/01/2022   Lab Results  Component Value Date   CHOL 208 (H) 11/02/2022   HDL 50 11/02/2022   LDLCALC 131 (H) 11/02/2022   TRIG 150 (H) 11/02/2022   CHOLHDL 2.8 11/09/2018   Lab Results   Component Value Date   VD25OH 43.8 11/02/2022   VD25OH 75.6 05/09/2022   VD25OH 36.6 02/01/2022   Lab Results  Component Value Date   WBC 8.9 09/08/2020   HGB 15.2 (H) 09/08/2020   HCT 47.0 (H) 09/08/2020   MCV 90.0 09/08/2020   PLT 224 09/08/2020   No results found for: "IRON", "TIBC", "FERRITIN"  Attestation Statements:   Reviewed by clinician on day of visit: allergies, medications, problem list, medical history, surgical history, family history, social history, and previous encounter notes.  I, Dawn Whitmire, FNP-C, am acting as transcriptionist for Dr. Jearld Lesch.  I have reviewed the above documentation for accuracy and completeness, and I agree with the above. Jearld Lesch, DO

## 2022-11-28 ENCOUNTER — Other Ambulatory Visit (HOSPITAL_COMMUNITY): Payer: Self-pay

## 2022-11-28 ENCOUNTER — Encounter: Payer: Self-pay | Admitting: Bariatrics

## 2022-11-28 ENCOUNTER — Ambulatory Visit: Payer: BC Managed Care – PPO | Admitting: Bariatrics

## 2022-11-28 VITALS — BP 116/60 | HR 92 | Temp 98.1°F | Ht 68.0 in | Wt 185.0 lb

## 2022-11-28 DIAGNOSIS — E559 Vitamin D deficiency, unspecified: Secondary | ICD-10-CM | POA: Diagnosis not present

## 2022-11-28 DIAGNOSIS — Z6828 Body mass index (BMI) 28.0-28.9, adult: Secondary | ICD-10-CM

## 2022-11-28 DIAGNOSIS — R632 Polyphagia: Secondary | ICD-10-CM | POA: Diagnosis not present

## 2022-11-28 DIAGNOSIS — E7800 Pure hypercholesterolemia, unspecified: Secondary | ICD-10-CM

## 2022-11-28 DIAGNOSIS — E669 Obesity, unspecified: Secondary | ICD-10-CM

## 2022-11-28 MED ORDER — WEGOVY 0.25 MG/0.5ML ~~LOC~~ SOAJ
0.5000 mg | SUBCUTANEOUS | 0 refills | Status: DC
  Filled 2022-11-28: qty 2, 28d supply, fill #0

## 2022-12-07 NOTE — Progress Notes (Unsigned)
Chief Complaint:   OBESITY Vanessa Sharp is here to discuss her progress with her obesity treatment plan along with follow-up of her obesity related diagnoses. Vanessa Sharp is on the Category 2 plan and states she is following her eating plan approximately 95% of the time. Vanessa Sharp states she has not been exercising.  Today's visit was #: 12 Starting weight: 203 lbs Starting date: 02/01/22 Today's weight: 185 lbs Today's date: 11/28/22 Total lbs lost to date: 18 Total lbs lost since last in-office visit: -2  Interim History: She is down 2 pound since her last visit.  Subjective:   1. Elevated cholesterol Eating more carbohydrates during the holidays.  2. Vitamin D deficiency Taking vitamin D.  Currently well-controlled.  Vitamin D-43.8.  3. Polyphagia No side effects from [facility].   Assessment/Plan:   1. Elevated cholesterol 1.  Will follow the plan and exercise. 2.  Will start walking at work.  2. Vitamin D deficiency Continue vitamin D.  3. Polyphagia Refill- - Semaglutide-Weight Management (WEGOVY) 0.25 MG/0.5ML SOAJ; Inject 0.5 mg into the skin once a week.  Dispense: 6 mL; Refill: 0  4. Generalized obesity BMI 28.0-28.9,adult 1.  Meal planning. 2.  Reviewed labs 11/02/2022: CMP, lipid, vitamin D, glucose, insulin.  Vanessa Sharp is currently in the action stage of change. As such, her goal is to continue with weight loss efforts. She has agreed to the Category 2 plan.  Exercise goals: All adults should avoid inactivity. Some physical activity is better than none, and adults who participate in any amount of physical activity gain some health benefits. Will start walking at work.  Behavioral modification strategies: increasing lean protein intake, decreasing simple carbohydrates, increasing vegetables, increasing water intake, decreasing eating out, no skipping meals, meal planning and cooking strategies, keeping healthy foods in the home, and planning for success.  Vanessa Sharp has  agreed to follow-up with our clinic in 4 weeks. She was informed of the importance of frequent follow-up visits to maximize her success with intensive lifestyle modifications for her multiple health conditions.    Objective:   Blood pressure 116/60, pulse 92, temperature 98.1 F (36.7 C), height '5\' 8"'$  (1.727 m), weight 185 lb (83.9 kg), last menstrual period 05/15/2017, SpO2 100 %. Body mass index is 28.13 kg/m.  General: Cooperative, alert, well developed, in no acute distress. HEENT: Conjunctivae and lids unremarkable. Cardiovascular: Regular rhythm.  Lungs: Normal work of breathing. Neurologic: No focal deficits.   Lab Results  Component Value Date   CREATININE 0.71 11/02/2022   BUN 9 11/02/2022   NA 141 11/02/2022   K 4.6 11/02/2022   CL 101 11/02/2022   CO2 24 11/02/2022   Lab Results  Component Value Date   ALT 12 11/02/2022   AST 13 11/02/2022   ALKPHOS 79 11/02/2022   BILITOT 0.7 11/02/2022   Lab Results  Component Value Date   HGBA1C 5.2 02/01/2022   HGBA1C 5.1 11/09/2018   Lab Results  Component Value Date   INSULIN 8.1 11/02/2022   INSULIN 6.4 02/01/2022   Lab Results  Component Value Date   TSH 0.658 02/01/2022   Lab Results  Component Value Date   CHOL 208 (H) 11/02/2022   HDL 50 11/02/2022   LDLCALC 131 (H) 11/02/2022   TRIG 150 (H) 11/02/2022   CHOLHDL 2.8 11/09/2018   Lab Results  Component Value Date   VD25OH 43.8 11/02/2022   VD25OH 75.6 05/09/2022   VD25OH 36.6 02/01/2022   Lab Results  Component Value Date  WBC 8.9 09/08/2020   HGB 15.2 (H) 09/08/2020   HCT 47.0 (H) 09/08/2020   MCV 90.0 09/08/2020   PLT 224 09/08/2020   No results found for: "IRON", "TIBC", "FERRITIN"  Attestation Statements:   Reviewed by clinician on day of visit: allergies, medications, problem list, medical history, surgical history, family history, social history, and previous encounter notes.  I, Dawn Whitmire, FNP-C, am acting as transcriptionist  for Dr. Jearld Lesch.  I have reviewed the above documentation for accuracy and completeness, and I agree with the above. Jearld Lesch, DO

## 2022-12-08 ENCOUNTER — Encounter: Payer: Self-pay | Admitting: Bariatrics

## 2022-12-26 ENCOUNTER — Ambulatory Visit: Payer: BC Managed Care – PPO | Admitting: Bariatrics

## 2023-01-02 ENCOUNTER — Other Ambulatory Visit (HOSPITAL_COMMUNITY): Payer: Self-pay

## 2023-01-03 ENCOUNTER — Other Ambulatory Visit: Payer: Self-pay | Admitting: Bariatrics

## 2023-01-03 DIAGNOSIS — E559 Vitamin D deficiency, unspecified: Secondary | ICD-10-CM

## 2023-01-04 ENCOUNTER — Other Ambulatory Visit (HOSPITAL_COMMUNITY): Payer: Self-pay

## 2023-01-11 ENCOUNTER — Encounter: Payer: Self-pay | Admitting: Bariatrics

## 2023-01-11 ENCOUNTER — Ambulatory Visit: Payer: BC Managed Care – PPO | Admitting: Bariatrics

## 2023-01-11 VITALS — BP 122/77 | HR 98 | Temp 98.1°F | Ht 68.0 in | Wt 182.0 lb

## 2023-01-11 DIAGNOSIS — E7849 Other hyperlipidemia: Secondary | ICD-10-CM | POA: Diagnosis not present

## 2023-01-11 DIAGNOSIS — E669 Obesity, unspecified: Secondary | ICD-10-CM | POA: Diagnosis not present

## 2023-01-11 DIAGNOSIS — R632 Polyphagia: Secondary | ICD-10-CM | POA: Diagnosis not present

## 2023-01-11 DIAGNOSIS — Z6827 Body mass index (BMI) 27.0-27.9, adult: Secondary | ICD-10-CM

## 2023-01-11 MED ORDER — QSYMIA 7.5-46 MG PO CP24: ORAL_CAPSULE | ORAL | 0 refills | Status: DC

## 2023-01-17 NOTE — Progress Notes (Signed)
Chief Complaint:   OBESITY Vanessa Sharp is here to discuss her progress with her obesity treatment plan along with follow-up of her obesity related diagnoses. [name] is on the Category 2 Plan and states she is following her eating plan approximately 95% of the time. [name] states she is walking for 60 minutes 3 times per week.  Today's visit was #: 13 Starting weight: 203 lbs Starting date: 02/01/2022 Today's weight: 182 lbs Today's date: 01/11/2023 Total lbs lost to date: 21 Total lbs lost since last in-office visit: 3  Interim History: Vanessa Sharp is down another 3 lbs and she is doing well overall. She has not been able to find Vanessa Sharp.   Subjective:   1. Polyphagia Vanessa Sharp has taken Topamax in the past. She had 1 episode of kidney stones. She notes hunger and cravings.   2. Other hyperlipidemia Vanessa Sharp is not on medications.   Assessment/Plan:   1. Polyphagia We will refill Qsymia for 1 month.   - Phentermine-Topiramate (QSYMIA) 7.5-46 MG CP24; Will take 1 capsule by mouth daily  Dispense: 30 capsule; Refill: 0  2. Other hyperlipidemia Vanessa Sharp is to eliminate trans fats.   3. Generalized obesity  4. BMI 27.0-27.9,adult Vanessa Sharp is currently in the action stage of change. As such, her goal is to continue with weight loss efforts. She has agreed to the Category 2 Plan.   Meal planning and intentional eating were discussed.   Exercise goals: As is.   Behavioral modification strategies: increasing lean protein intake, decreasing simple carbohydrates, increasing vegetables, increasing water intake, decreasing eating out, no skipping meals, meal planning and cooking strategies, keeping healthy foods in the home, and planning for success.  Vanessa Sharp has agreed to follow-up with our clinic in 4 weeks. She was informed of the importance of frequent follow-up visits to maximize her success with intensive lifestyle modifications for her multiple health conditions.   Objective:    Blood pressure 122/77, pulse 98, temperature 98.1 F (36.7 C), height  (1.727 m), weight 182 lb (82.6 kg), last menstrual period 05/15/2017, SpO2 96 %. Body mass index is 27.67 kg/m.  General: Cooperative, alert, well developed, in no acute distress. HEENT: Conjunctivae and lids unremarkable. Cardiovascular: Regular rhythm.  Lungs: Normal work of breathing. Neurologic: No focal deficits.   Lab Results  Component Value Date   CREATININE 0.71 11/02/2022   BUN 9 11/02/2022   NA 141 11/02/2022   K 4.6 11/02/2022   CL 101 11/02/2022   CO2 24 11/02/2022   Lab Results  Component Value Date   ALT 12 11/02/2022   AST 13 11/02/2022   ALKPHOS 79 11/02/2022   BILITOT 0.7 11/02/2022   Lab Results  Component Value Date   HGBA1C 5.2 02/01/2022   HGBA1C 5.1 11/09/2018   Lab Results  Component Value Date   INSULIN 8.1 11/02/2022   INSULIN 6.4 02/01/2022   Lab Results  Component Value Date   TSH 0.658 02/01/2022   Lab Results  Component Value Date   CHOL 208 (H) 11/02/2022   HDL 50 11/02/2022   LDLCALC 131 (H) 11/02/2022   TRIG 150 (H) 11/02/2022   CHOLHDL 2.8 11/09/2018   Lab Results  Component Value Date   VD25OH 43.8 11/02/2022   VD25OH 75.6 05/09/2022   VD25OH 36.6 02/01/2022   Lab Results  Component Value Date   WBC 8.9 09/08/2020   HGB 15.2 (H) 09/08/2020   HCT 47.0 (H) 09/08/2020   MCV 90.0 09/08/2020   PLT 224 09/08/2020  No results found for: "IRON", "TIBC", "FERRITIN"  Attestation Statements:   Reviewed by clinician on day of visit: allergies, medications, problem list, medical history, surgical history, family history, social history, and previous encounter notes.   Wilhemena Durie, am acting as Location manager for [employer], DO.  I have reviewed the above documentation for accuracy and completeness, and I agree with the above. Jearld Lesch, DO

## 2023-01-18 ENCOUNTER — Encounter: Payer: Self-pay | Admitting: Bariatrics

## 2023-01-19 ENCOUNTER — Telehealth: Payer: Self-pay

## 2023-01-19 NOTE — Telephone Encounter (Signed)
Started PA for [employer] via covermymeds

## 2023-02-09 ENCOUNTER — Encounter: Payer: Self-pay | Admitting: Bariatrics

## 2023-02-09 ENCOUNTER — Ambulatory Visit: Payer: BC Managed Care – PPO | Admitting: Bariatrics

## 2023-02-09 DIAGNOSIS — E669 Obesity, unspecified: Secondary | ICD-10-CM | POA: Diagnosis not present

## 2023-02-09 DIAGNOSIS — E559 Vitamin D deficiency, unspecified: Secondary | ICD-10-CM

## 2023-02-09 DIAGNOSIS — R632 Polyphagia: Secondary | ICD-10-CM

## 2023-02-09 DIAGNOSIS — Z6827 Body mass index (BMI) 27.0-27.9, adult: Secondary | ICD-10-CM

## 2023-02-09 MED ORDER — QSYMIA 7.5-46 MG PO CP24: ORAL_CAPSULE | ORAL | 0 refills | Status: DC

## 2023-02-09 NOTE — Progress Notes (Signed)
   WEIGHT SUMMARY AND BIOMETRICS  Weight Lost Since Last Visit: 1lb   Vitals Temp: 97.7 F (36.5 C) BP: 133/77 Pulse Rate: 92 SpO2: 98 %   Anthropometric Measurements Height: 5\' 8"  (1.727 m) Weight: 181 lb (82.1 kg) BMI (Calculated): 27.53 Weight at Last Visit: 182lb Weight Lost Since Last Visit: 1lb Starting Weight: 203lb Total Weight Loss (lbs): 22 lb (9.979 kg)   Body Composition  Body Fat %: 36.9 % Fat Mass (lbs): 66.8 lbs Muscle Mass (lbs): 108.6 lbs Total Body Water (lbs): 74.8 lbs Visceral Fat Rating : 7   Other Clinical Data Fasting: no Labs: no Today's Visit #: 14 Starting Date: 02/01/22    OBESITY Vanessa Sharp is here to discuss her progress with her obesity treatment plan along with follow-up of her obesity related diagnoses.     Nutrition Plan: the Category 2 plan - 95% adherence.  Current exercise: walking  Interim History:  She is down 1 lb since her last visit.  Protein intake is as prescribed and Water intake is adequate.  Pharmacotherapy: Vanessa Sharp is on Qsymia 7.5/46 mg 1 capsule by mouth daily in am Adverse side effects: None Hunger is moderately controlled.  Cravings are moderately controlled.  Assessment/Plan:   1. Vitamin D deficiency Her energy is adequate and she is taking her vitamin as prescribed.  Plan: Continue vitamin D. Continue exercise.   2. Polyphagia She is taking Qsymia 7.5/46 mg daily.   Plan: Continue Qsymia. Will increase her intake of raw vegetables in the afternoon when hunger cones.      Generalized Obesity: Current BMI BMI (Calculated): 27.53   Pharmacotherapy Plan Continue and refill  Qsymia 7.5/46 mg 1 capsule by mouth daily in am  Vanessa Sharp is currently in the action stage of change. As such, her goal is to continue with weight loss efforts.  She has agreed to the Category 2 plan.  Exercise goals: For substantial health benefits, adults should do at least 150 minutes (2 hours and 30 minutes) a  week of moderate-intensity, or 75 minutes (1 hour and 15 minutes) a week of vigorous-intensity aerobic physical activity, or an equivalent combination of moderate- and vigorous-intensity aerobic activity. Aerobic activity should be performed in episodes of at least 10 minutes, and preferably, it should be spread throughout the week.  Behavioral modification strategies: decreasing simple carbohydrates , no meal skipping, planning for success, increasing vegetables, increasing fiber rich foods, and travel eating strategies.  Vanessa Sharp has agreed to follow-up with our clinic in 4 weeks.   No orders of the defined types were placed in this encounter.   Medications Discontinued During This Encounter  Medication Reason   Semaglutide-Weight Management (WEGOVY) 0.25 MG/0.5ML SOAJ Patient Preference     No orders of the defined types were placed in this encounter.     Objective:   VITALS: Per patient if applicable, see vitals. GENERAL: Alert and in no acute distress. CARDIOPULMONARY: No increased WOB. Speaking in clear sentences.  PSYCH: Pleasant and cooperative. Speech normal rate and rhythm. Affect is appropriate. Insight and judgement are appropriate. Attention is focused, linear, and appropriate.  NEURO: Oriented as arrived to appointment on time with no prompting.   Attestation Statements:   This was prepared with the assistance of Engineer, civil (consulting).  Occasional wrong-word or sound-a-like substitutions may have occurred due to the inherent limitations of voice recognition software.   Corinna Capra, DO

## 2023-03-09 ENCOUNTER — Ambulatory Visit: Payer: BC Managed Care – PPO | Admitting: Bariatrics

## 2023-03-09 ENCOUNTER — Encounter: Payer: Self-pay | Admitting: Bariatrics

## 2023-03-09 DIAGNOSIS — E559 Vitamin D deficiency, unspecified: Secondary | ICD-10-CM

## 2023-03-09 DIAGNOSIS — E669 Obesity, unspecified: Secondary | ICD-10-CM | POA: Diagnosis not present

## 2023-03-09 DIAGNOSIS — Z6826 Body mass index (BMI) 26.0-26.9, adult: Secondary | ICD-10-CM

## 2023-03-09 DIAGNOSIS — R632 Polyphagia: Secondary | ICD-10-CM

## 2023-03-09 MED ORDER — QSYMIA 7.5-46 MG PO CP24: ORAL_CAPSULE | ORAL | 0 refills | Status: DC

## 2023-03-09 MED ORDER — VITAMIN D (ERGOCALCIFEROL) 1.25 MG (50000 UNIT) PO CAPS: 50000.0000 [IU] | ORAL_CAPSULE | ORAL | 0 refills | Status: DC

## 2023-03-09 NOTE — Progress Notes (Signed)
   WEIGHT SUMMARY AND BIOMETRICS  Weight Lost Since Last Visit: 5lb   Vitals Temp: 98.3 F (36.8 C) BP: 125/72 Pulse Rate: (!) 101 SpO2: 97 %   Anthropometric Measurements Height: 5\' 8"  (1.727 m) Weight: 176 lb (79.8 kg) BMI (Calculated): 26.77 Weight at Last Visit: 181lb Weight Lost Since Last Visit: 5lb Starting Weight: 203lb Total Weight Loss (lbs): 27 lb (12.2 kg)   Body Composition  Body Fat %: 28.2 % Fat Mass (lbs): 49.8 lbs Muscle Mass (lbs): 120.6 lbs Total Body Water (lbs): 76.2 lbs Visceral Fat Rating : 5   Other Clinical Data Fasting: no Labs: no Today's Visit #: 15 Starting Date: 02/01/22    OBESITY Vanessa Sharp is here to discuss her progress with her obesity treatment plan along with follow-up of her obesity related diagnoses.     Nutrition Plan: the Category 2 plan - 99% adherence.  Current exercise: walking  Interim History:  She is down 5 lbs since her last visit  Eating all of the food on the plan., Protein intake is as prescribed, Is not skipping meals, Water intake is adequate., and Denies polyphagia  Pharmacotherapy: Vanessa Sharp is on Qsymia 7.5/46 mg 1 capsule by mouth daily in am Adverse side effects: None Hunger is moderately controlled.  Cravings are moderately controlled.  Assessment/Plan:   1. Vitamin D deficiency  Vitamin D is not at goal of 50.  Most recent vitamin D level was 43. She is on  prescription ergocalciferol 50,000 IU weekly. Lab Results  Component Value Date   VD25OH 43.8 11/02/2022   VD25OH 75.6 05/09/2022   VD25OH 36.6 02/01/2022    Plan: Refill prescription vitamin D 50,000 IU weekly. # 12 with no refills.   2. Polyphagia Polyphagia Vanessa Sharp endorses excessive hunger.  Medication(s): Qsymia Effects of medication:  moderately controlled. Cravings are moderately controlled.   Plan: Medication(s): Qsymia 7.5/46 mg 1 capsule by mouth daily in am Will increase water, protein and fiber to help  assuage hunger.  Will minimize foods that have a high glucose index/load to minimize reactive hypoglycemia.       Generalized Obesity: Current BMI BMI (Calculated): 26.77   Pharmacotherapy Plan Continue and refill  Qsymia 7.5/46 mg 1 capsule by mouth daily in am  Vanessa Sharp is currently in the action stage of change. As such, her goal is to continue with weight loss efforts.  She has agreed to the Category 2 plan.  Exercise goals: All adults should avoid inactivity. Some physical activity is better than none, and adults who participate in any amount of physical activity gain some health benefits.  Behavioral modification strategies: increasing lean protein intake, meal planning , planning for success, and increasing vegetables.  Vanessa Sharp has agreed to follow-up with our clinic in 4 weeks.       Objective:   VITALS: Per patient if applicable, see vitals. GENERAL: Alert and in no acute distress. CARDIOPULMONARY: No increased WOB. Speaking in clear sentences.  PSYCH: Pleasant and cooperative. Speech normal rate and rhythm. Affect is appropriate. Insight and judgement are appropriate. Attention is focused, linear, and appropriate.  NEURO: Oriented as arrived to appointment on time with no prompting.   Attestation Statements:   This was prepared with the assistance of Engineer, civil (consulting).  Occasional wrong-word or sound-a-like substitutions may have occurred due to the inherent limitations of voice recognition software.   Corinna Capra, DO

## 2023-04-06 ENCOUNTER — Ambulatory Visit: Payer: BC Managed Care – PPO | Admitting: Bariatrics

## 2023-04-12 ENCOUNTER — Encounter: Payer: Self-pay | Admitting: Bariatrics

## 2023-04-12 ENCOUNTER — Ambulatory Visit: Payer: BC Managed Care – PPO | Admitting: Bariatrics

## 2023-04-12 DIAGNOSIS — E559 Vitamin D deficiency, unspecified: Secondary | ICD-10-CM | POA: Diagnosis not present

## 2023-04-12 DIAGNOSIS — Z6826 Body mass index (BMI) 26.0-26.9, adult: Secondary | ICD-10-CM | POA: Diagnosis not present

## 2023-04-12 DIAGNOSIS — E669 Obesity, unspecified: Secondary | ICD-10-CM

## 2023-04-12 DIAGNOSIS — R632 Polyphagia: Secondary | ICD-10-CM

## 2023-04-12 MED ORDER — QSYMIA 7.5-46 MG PO CP24: ORAL_CAPSULE | ORAL | 0 refills | Status: DC

## 2023-04-12 NOTE — Progress Notes (Signed)
WEIGHT SUMMARY AND BIOMETRICS  Weight Lost Since Last Visit: 0  Weight Gained Since Last Visit: 0   Vitals Temp: 97.9 F (36.6 C) BP: 124/81 Pulse Rate: 77 SpO2: 99 %   Anthropometric Measurements Height: 5\' 8"  (1.727 m) Weight: 176 lb (79.8 kg) BMI (Calculated): 26.77 Weight at Last Visit: 176lb Weight Lost Since Last Visit: 0 Weight Gained Since Last Visit: 0 Starting Weight: 203lb Total Weight Loss (lbs): 27 lb (12.2 kg)   Body Composition  Body Fat %: 36.7 % Fat Mass (lbs): 64.8 lbs Muscle Mass (lbs): 106.4 lbs Total Body Water (lbs): 75.4 lbs Visceral Fat Rating : 7   Other Clinical Data Fasting: no Labs: no Today's Visit #: 16 Starting Date: 02/01/22    OBESITY Vanessa Sharp is here to discuss her progress with her obesity treatment plan along with follow-up of her obesity related diagnoses.     Nutrition Plan: the Category 2 plan - 95% adherence.  Current exercise: walking  Interim History:  Her weight remains the same.  Eating all of the food on the plan., Protein intake is as prescribed, Is not skipping meals, Water intake is inadequate., Denies polyphagia, and Denies excessive cravings.  Pharmacotherapy: Vanessa Sharp is on Qsymia 7.5/46 mg 1 capsule by mouth daily in am Adverse side effects: None Hunger is moderately controlled.  Cravings are moderately controlled.  Assessment/Plan:   1. Vitamin D deficiency  Vitamin D is not at goal of 50.  Most recent vitamin D level was 43.8. She is on  prescription ergocalciferol 50,000 IU weekly. Lab Results  Component Value Date   VD25OH 43.8 11/02/2022   VD25OH 75.6 05/09/2022   VD25OH 36.6 02/01/2022    Plan: Refill prescription vitamin D 50,000 IU weekly.   2. Polyphagia Polyphagia Vanessa Sharp endorses excessive hunger.  Medication(s): Qsymia Effects of medication:  moderately controlled. Cravings are moderately controlled.   Plan: Medication(s): Qsymia 7.5/46 mg 1 capsule by mouth  daily in am Will increase water, protein and fiber to help assuage hunger.  Will minimize foods that have a high glucose index/load to minimize reactive hypoglycemia.    Generalized Obesity: Current BMI BMI (Calculated): 26.77   Pharmacotherapy Plan Continue and refill  Qsymia 7.5/46 mg 1 capsule by mouth daily in am  Vanessa Sharp is currently in the action stage of change. As such, her goal is to continue with weight loss efforts.  She has agreed to the Category 2 plan.  Exercise goals: For substantial health benefits, adults should do at least 150 minutes (2 hours and 30 minutes) a week of moderate-intensity, or 75 minutes (1 hour and 15 minutes) a week of vigorous-intensity aerobic physical activity, or an equivalent combination of moderate- and vigorous-intensity aerobic activity. Aerobic activity should be performed in episodes of at least 10 minutes, and preferably, it should be spread throughout the week.  Behavioral modification strategies: increasing lean protein intake, decreasing simple carbohydrates , no meal skipping, avoiding temptations, and mindful eating.  Vanessa Sharp has agreed to follow-up with our clinic in 4 weeks. Will do fasting labs at that time.       Objective:   VITALS: Per patient if applicable, see vitals. GENERAL: Alert and in no acute distress. CARDIOPULMONARY: No increased WOB. Speaking in clear sentences.  PSYCH: Pleasant and cooperative. Speech normal rate and rhythm. Affect is appropriate. Insight and judgement are appropriate. Attention is focused, linear, and appropriate.  NEURO: Oriented as arrived to appointment on time with no prompting.   Attestation Statements:   This  was prepared with the assistance of Engineer, civil (consulting).  Occasional wrong-word or sound-a-like substitutions may have occurred due to the inherent limitations of voice recognition software.   Corinna Capra, DO

## 2023-05-24 ENCOUNTER — Ambulatory Visit: Payer: BC Managed Care – PPO | Admitting: Bariatrics

## 2023-05-24 ENCOUNTER — Encounter: Payer: Self-pay | Admitting: Bariatrics

## 2023-05-24 ENCOUNTER — Telehealth: Payer: Self-pay

## 2023-05-24 VITALS — BP 111/67 | HR 105 | Temp 98.2°F | Ht 68.0 in | Wt 176.0 lb

## 2023-05-24 DIAGNOSIS — R7309 Other abnormal glucose: Secondary | ICD-10-CM

## 2023-05-24 DIAGNOSIS — R632 Polyphagia: Secondary | ICD-10-CM

## 2023-05-24 DIAGNOSIS — E669 Obesity, unspecified: Secondary | ICD-10-CM | POA: Diagnosis not present

## 2023-05-24 DIAGNOSIS — E7800 Pure hypercholesterolemia, unspecified: Secondary | ICD-10-CM

## 2023-05-24 DIAGNOSIS — E559 Vitamin D deficiency, unspecified: Secondary | ICD-10-CM

## 2023-05-24 DIAGNOSIS — Z6826 Body mass index (BMI) 26.0-26.9, adult: Secondary | ICD-10-CM | POA: Diagnosis not present

## 2023-05-24 MED ORDER — QSYMIA 7.5-46 MG PO CP24: ORAL_CAPSULE | ORAL | 0 refills | Status: DC

## 2023-05-24 NOTE — Telephone Encounter (Signed)
Started PA for Qsymia 7.5mg 

## 2023-05-24 NOTE — Progress Notes (Signed)
WEIGHT SUMMARY AND BIOMETRICS  Weight Lost Since Last Visit: 2lb  Vitals Temp: 98.2 F (36.8 C) BP: 111/67 Pulse Rate: (!) 105 SpO2: 100 %   Anthropometric Measurements Height: 5\' 8"  (1.727 m) Weight: 176 lb (79.8 kg) BMI (Calculated): 26.77 Weight at Last Visit: 176lb Weight Lost Since Last Visit: 2lb Starting Weight: 203lb Total Weight Loss (lbs): 28 lb (12.7 kg)   Body Composition  Body Fat %: 26.6 % Fat Mass (lbs): 46.4 lbs Muscle Mass (lbs): 122 lbs Total Body Water (lbs): 74.6 lbs Visceral Fat Rating : 5   Other Clinical Data Fasting: yes Labs: yes Today's Visit #: 17 Starting Date: 02/01/22    OBESITY Alicemarie is here to discuss her progress with her obesity treatment plan along with follow-up of her obesity related diagnoses.     Nutrition Plan: the Category 2 plan - 90% adherence.  Current exercise: none  Interim History:  She is down 2 lb since her visit. Eating all of the food on the plan., Protein intake is as prescribed, Is not skipping meals, Water intake is adequate., and Denies polyphagia  Pharmacotherapy: Geriah is on Qsymia 7.5/46 mg 1 capsule by mouth daily in am Adverse side effects: None Hunger is moderately controlled.  Cravings are moderately controlled.  Assessment/Plan:   1. Vitamin D deficiency Vitamin D Deficiency Vitamin D is not at goal of 50.  Most recent vitamin D level was 43.8. She is on  prescription ergocalciferol 50,000 IU weekly. Lab Results  Component Value Date   VD25OH 43.8 11/02/2022   VD25OH 75.6 05/09/2022   VD25OH 36.6 02/01/2022    Plan: Continue prescription vitamin D 50,000 IU weekly.   2. Polyphagia Polyphagia Shaleigh endorses excessive hunger.  Medication(s): Qsymia 7.5-46  Effects of medication:  moderately controlled. Cravings are moderately controlled.   Plan: Medication(s): Qsymia 7.5/46 mg 1 capsule by mouth daily in am Will increase water, protein and fiber to help  assuage hunger.  Will minimize foods that have a high glucose index/load to minimize reactive hypoglycemia.  Continue exercise.   Labs done today (CMP, Lipids,  insulin, vitamin D).    Generalized Obesity: Current BMI BMI (Calculated): 26.77   Pharmacotherapy Plan Continue and refill  Qsymia 7.5/46 mg 1 capsule by mouth daily in am  Pebble is currently in the action stage of change. As such, her goal is to continue with weight loss efforts.  She has agreed to the Category 2 plan.  Exercise goals: For substantial health benefits, adults should do at least 150 minutes (2 hours and 30 minutes) a week of moderate-intensity, or 75 minutes (1 hour and 15 minutes) a week of vigorous-intensity aerobic physical activity, or an equivalent combination of moderate- and vigorous-intensity aerobic activity. Aerobic activity should be performed in episodes of at least 10 minutes, and preferably, it should be spread throughout the week.  Behavioral modification strategies: increasing lean protein intake, decreasing simple carbohydrates , no meal skipping, increase water intake, better snacking choices, planning for success, increasing vegetables, increasing fiber rich foods, and mindful eating.  Elline has agreed to follow-up with our clinic in 4 weeks.      Objective:   VITALS: Per patient if applicable, see vitals. GENERAL: Alert and in no acute distress. CARDIOPULMONARY: No increased WOB. Speaking in clear sentences.  PSYCH: Pleasant and cooperative. Speech normal rate and rhythm. Affect is appropriate. Insight and judgement are appropriate. Attention is focused, linear, and appropriate.  NEURO: Oriented as arrived to appointment on time with  no prompting.   Attestation Statements:    This was prepared with the assistance of Engineer, civil (consulting).  Occasional wrong-word or sound-a-like substitutions may have occurred due to the inherent limitations of voice recognition software.  Corinna Capra, DO

## 2023-05-25 ENCOUNTER — Telehealth (INDEPENDENT_AMBULATORY_CARE_PROVIDER_SITE_OTHER): Payer: Self-pay

## 2023-05-25 LAB — COMPREHENSIVE METABOLIC PANEL
ALT: 10 IU/L (ref 0–32)
AST: 12 IU/L (ref 0–40)
Albumin: 4.7 g/dL (ref 3.9–4.9)
Alkaline Phosphatase: 73 IU/L (ref 44–121)
BUN/Creatinine Ratio: 16 (ref 9–23)
BUN: 11 mg/dL (ref 6–24)
Bilirubin Total: 0.6 mg/dL (ref 0.0–1.2)
CO2: 22 mmol/L (ref 20–29)
Calcium: 9.6 mg/dL (ref 8.7–10.2)
Chloride: 102 mmol/L (ref 96–106)
Creatinine, Ser: 0.68 mg/dL (ref 0.57–1.00)
Globulin, Total: 2.2 g/dL (ref 1.5–4.5)
Glucose: 81 mg/dL (ref 70–99)
Potassium: 4.1 mmol/L (ref 3.5–5.2)
Sodium: 139 mmol/L (ref 134–144)
Total Protein: 6.9 g/dL (ref 6.0–8.5)
eGFR: 109 mL/min/{1.73_m2} (ref >59)

## 2023-05-25 LAB — LIPID PANEL WITH LDL/HDL RATIO
Cholesterol, Total: 174 mg/dL (ref 100–199)
HDL: 46 mg/dL (ref >39)
LDL Chol Calc (NIH): 102 mg/dL — ABNORMAL HIGH (ref 0–99)
LDL/HDL Ratio: 2.2 ratio (ref 0.0–3.2)
Triglycerides: 148 mg/dL (ref 0–149)
VLDL Cholesterol Cal: 26 mg/dL (ref 5–40)

## 2023-05-25 LAB — VITAMIN D 25 HYDROXY (VIT D DEFICIENCY, FRACTURES): Vit D, 25-Hydroxy: 55.5 ng/mL (ref 30.0–100.0)

## 2023-05-25 LAB — INSULIN, RANDOM: INSULIN: 8.3 u[IU]/mL (ref 2.6–24.9)

## 2023-05-25 NOTE — Telephone Encounter (Signed)
Qsymia approved 05/24/23-05/23/24 ref/ID #[id number]

## 2023-05-25 NOTE — Telephone Encounter (Signed)
PA for Qsymia has been approved

## 2023-06-26 ENCOUNTER — Other Ambulatory Visit (HOSPITAL_COMMUNITY): Payer: Self-pay | Admitting: Interventional Radiology

## 2023-06-26 ENCOUNTER — Telehealth (HOSPITAL_COMMUNITY): Payer: Self-pay

## 2023-06-26 DIAGNOSIS — I639 Cerebral infarction, unspecified: Secondary | ICD-10-CM

## 2023-06-26 NOTE — Telephone Encounter (Signed)
Called to schedule MRA. Pt will get this done by her neurologist at [facility]. I will have Dr. Corliss Skains view once done. AB

## 2023-06-28 ENCOUNTER — Ambulatory Visit: Payer: BC Managed Care – PPO | Admitting: Bariatrics

## 2023-07-11 ENCOUNTER — Ambulatory Visit: Payer: BC Managed Care – PPO | Admitting: Bariatrics

## 2023-07-11 ENCOUNTER — Encounter: Payer: Self-pay | Admitting: Bariatrics

## 2023-07-11 VITALS — BP 124/77 | HR 82 | Temp 98.1°F | Ht 68.0 in | Wt 178.0 lb

## 2023-07-11 DIAGNOSIS — R632 Polyphagia: Secondary | ICD-10-CM | POA: Diagnosis not present

## 2023-07-11 DIAGNOSIS — E669 Obesity, unspecified: Secondary | ICD-10-CM

## 2023-07-11 DIAGNOSIS — E559 Vitamin D deficiency, unspecified: Secondary | ICD-10-CM

## 2023-07-11 DIAGNOSIS — Z6827 Body mass index (BMI) 27.0-27.9, adult: Secondary | ICD-10-CM

## 2023-07-11 DIAGNOSIS — E66811 Obesity, class 1: Secondary | ICD-10-CM

## 2023-07-11 MED ORDER — QSYMIA 7.5-46 MG PO CP24
ORAL_CAPSULE | ORAL | 0 refills | Status: DC
Start: 2023-07-11 — End: 2023-08-08
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
## 2023-08-08 ENCOUNTER — Encounter: Payer: Self-pay | Admitting: Bariatrics

## 2023-08-08 ENCOUNTER — Ambulatory Visit: Payer: BC Managed Care – PPO | Admitting: Bariatrics

## 2023-08-08 VITALS — BP 121/76 | HR 88 | Temp 97.7°F | Ht 68.0 in | Wt 173.0 lb

## 2023-08-08 DIAGNOSIS — E559 Vitamin D deficiency, unspecified: Secondary | ICD-10-CM

## 2023-08-08 DIAGNOSIS — E6609 Other obesity due to excess calories: Secondary | ICD-10-CM

## 2023-08-08 DIAGNOSIS — E669 Obesity, unspecified: Secondary | ICD-10-CM

## 2023-08-08 DIAGNOSIS — Z6826 Body mass index (BMI) 26.0-26.9, adult: Secondary | ICD-10-CM

## 2023-08-08 DIAGNOSIS — R632 Polyphagia: Secondary | ICD-10-CM

## 2023-08-08 MED ORDER — QSYMIA 7.5-46 MG PO CP24
ORAL_CAPSULE | ORAL | 0 refills | Status: DC
Start: 2023-08-08 — End: 2023-09-05

## 2023-08-08 NOTE — Progress Notes (Signed)
WEIGHT SUMMARY AND BIOMETRICS  Weight Lost Since Last Visit: 5lb  Weight Gained Since Last Visit: 0   Vitals Temp: 97.7 F (36.5 C) BP: 121/76 Pulse Rate: 88 SpO2: 100 %   Anthropometric Measurements Height: 5\' 8"  (1.727 m) Weight: 173 lb (78.5 kg) BMI (Calculated): 26.31 Weight at Last Visit: 178lb Weight Lost Since Last Visit: 5lb Weight Gained Since Last Visit: 0 Starting Weight: 203lb Total Weight Loss (lbs): 31 lb (14.1 kg)   Body Composition  Body Fat %: 29.1 % Fat Mass (lbs): 50.6 lbs Muscle Mass (lbs): 116.8 lbs Total Body Water (lbs): 75 lbs Visceral Fat Rating : 5   Other Clinical Data Fasting: no Labs: no Today's Visit #: 19 Starting Date: 02/01/22    OBESITY Carreen is here to discuss her progress with her obesity treatment plan along with follow-up of her obesity related diagnoses.    Nutrition Plan: the Category 2 plan - 95% adherence.  Current exercise: walking  Interim History:  She is down another 5 lbs since her last visit.  Eating all of the food on the plan., Protein intake is as prescribed, and Water intake is adequate.   Pharmacotherapy: Gindy is on Qsymia 7.5/46 mg 1 capsule by mouth daily in am Adverse side effects: None Hunger is moderately controlled.  Cravings are moderately controlled.  Assessment/Plan:   1. Polyphagia  Kaitlyn endorses excessive hunger.  Medication(s): Qsymia Effects of medication:  moderately controlled. Cravings are moderately controlled.   Plan: Medication(s): Qsymia 7.5/46 mg 1 capsule by mouth daily in am Will increase water, protein and fiber to help assuage hunger.  Will minimize foods that have a high glucose index/load to minimize reactive hypoglycemia.   Vitamin D Deficiency Vitamin D is at goal of 50.  Most recent vitamin D level was 55.5. She is on  prescription  ergocalciferol 50,000 IU weekly. Lab Results  Component Value Date   VD25OH 55.5 05/24/2023   VD25OH 43.8 11/02/2022   VD25OH 75.6 05/09/2022    Plan: Continue vitamin D.    Generalized Obesity: Current BMI BMI (Calculated): 26.31   Pharmacotherapy Plan Continue  Qsymia 7.5/46 mg 1 capsule by mouth daily in am  Taiya is currently in the action stage of change. As such, her goal is to continue with weight loss efforts.  She has agreed to the Category 2 plan.  Exercise goals: For substantial health benefits, adults should do at least 150 minutes (2 hours and 30 minutes) a week of moderate-intensity, or 75 minutes (1 hour and 15 minutes) a week of vigorous-intensity aerobic physical activity, or an equivalent combination of moderate- and vigorous-intensity aerobic activity. Aerobic activity should be performed in episodes of at least 10 minutes, and preferably, it should be spread throughout the week.  Behavioral modification strategies: increasing lean protein intake, no meal skipping, increase water intake, better snacking choices, planning for  success, decrease snacking , avoiding temptations, and mindful eating.  Mckinly has agreed to follow-up with our clinic in 4 weeks.       Objective:   VITALS: Per patient if applicable, see vitals. GENERAL: Alert and in no acute distress. CARDIOPULMONARY: No increased WOB. Speaking in clear sentences.  PSYCH: Pleasant and cooperative. Speech normal rate and rhythm. Affect is appropriate. Insight and judgement are appropriate. Attention is focused, linear, and appropriate.  NEURO: Oriented as arrived to appointment on time with no prompting.   Attestation Statements:   This was prepared with the assistance of Engineer, civil (consulting).  Occasional wrong-word or sound-a-like substitutions may have occurred due to the inherent limitations of voice recognition   Corinna Capra, DO

## 2023-09-05 ENCOUNTER — Encounter: Payer: Self-pay | Admitting: Bariatrics

## 2023-09-05 ENCOUNTER — Ambulatory Visit: Payer: BC Managed Care – PPO | Admitting: Bariatrics

## 2023-09-05 VITALS — BP 131/83 | HR 86 | Temp 98.3°F | Ht 68.0 in | Wt 172.0 lb

## 2023-09-05 DIAGNOSIS — E669 Obesity, unspecified: Secondary | ICD-10-CM | POA: Diagnosis not present

## 2023-09-05 DIAGNOSIS — Z6826 Body mass index (BMI) 26.0-26.9, adult: Secondary | ICD-10-CM

## 2023-09-05 DIAGNOSIS — R632 Polyphagia: Secondary | ICD-10-CM

## 2023-09-05 DIAGNOSIS — E559 Vitamin D deficiency, unspecified: Secondary | ICD-10-CM | POA: Diagnosis not present

## 2023-09-05 DIAGNOSIS — Z6825 Body mass index (BMI) 25.0-25.9, adult: Secondary | ICD-10-CM

## 2023-09-05 MED ORDER — QSYMIA 7.5-46 MG PO CP24
ORAL_CAPSULE | ORAL | 0 refills | Status: DC
Start: 2023-09-05 — End: 2023-11-07

## 2023-09-05 MED ORDER — VITAMIN D (ERGOCALCIFEROL) 1.25 MG (50000 UNIT) PO CAPS
50000.0000 [IU] | ORAL_CAPSULE | ORAL | 0 refills | Status: DC
Start: 2023-09-05 — End: 2023-11-07

## 2023-09-05 NOTE — Progress Notes (Signed)
WEIGHT SUMMARY AND BIOMETRICS  Weight Lost Since Last Visit: 1lb  Weight Gained Since Last Visit: 0   Vitals Temp: 98.3 F (36.8 C) BP: 131/83 Pulse Rate: 86 SpO2: 100 %   Anthropometric Measurements Height: 5\' 8"  (1.727 m) Weight: 172 lb (78 kg) BMI (Calculated): 26.16 Weight at Last Visit: 173lb Weight Lost Since Last Visit: 1lb Weight Gained Since Last Visit: 0 Starting Weight: 203lb Total Weight Loss (lbs): 32 lb (14.5 kg)   Body Composition  Body Fat %: 38.1 % Fat Mass (lbs): 65.8 lbs Muscle Mass (lbs): 101.4 lbs Total Body Water (lbs): 73.2 lbs Visceral Fat Rating : 7   Other Clinical Data Fasting: no Labs: no Today's Visit #: 20 Starting Date: 02/01/22    OBESITY Vanessa Sharp is here to discuss her progress with her obesity treatment plan along with follow-up of her obesity related diagnoses.    Nutrition Plan: the Category 2 plan - 95% adherence.  Current exercise: none  Interim History:  She is down 1 lb since her last visit which was over the holiday.  Eating all of the food on the plan., Protein intake is as prescribed, Is not skipping meals, Water intake is adequate., Reports polyphagia, and Reports excessive cravings.   Pharmacotherapy: Vanessa Sharp is on Qsymia 7.5/46 mg 1 capsule by mouth daily in am Adverse side effects: None Hunger is moderately controlled.  Cravings are moderately controlled.  Assessment/Plan:   1. Polyphagia Polyphagia Vanessa Sharp endorses excessive hunger.  Medication(s): Qsymia Effects of medication:  moderately controlled. Cravings are moderately controlled.   Plan: Medication(s): Qsymia 7.5/46 mg 1 capsule by mouth daily in am Will increase water, protein and fiber to help assuage hunger.  Will minimize foods that have a high glucose index/load to minimize reactive hypoglycemia.    2. Vitamin D  deficiency Vitamin D Deficiency Vitamin D is at goal of 50.  Most recent vitamin D level was 55.5. She is on  prescription ergocalciferol 50,000 IU weekly. Lab Results  Component Value Date   VD25OH 55.5 05/24/2023   VD25OH 43.8 11/02/2022   VD25OH 75.6 05/09/2022    Plan: Refill prescription vitamin D 50,000 IU weekly.      Generalized Obesity: Current BMI BMI (Calculated): 26.16   Pharmacotherapy Plan Continue and refill  Qsymia 7.5/46 mg 1 capsule by mouth daily in am  Vanessa Sharp is currently in the action stage of change. As such, her goal is to continue with weight loss efforts.  She has agreed to the Category 2 plan.  Exercise goals: For substantial health benefits, adults should do at least 150 minutes (2 hours and 30 minutes) a week of moderate-intensity, or 75 minutes (1 hour and 15 minutes) a week of vigorous-intensity aerobic physical activity, or an equivalent combination of moderate- and vigorous-intensity aerobic activity. Aerobic activity should be performed in episodes of at least 10 minutes, and preferably,  it should be spread throughout the week. She will get back to walking on her lunch break.   Behavioral modification strategies: increasing lean protein intake, no meal skipping, decrease eating out, meal planning , planning for success, increasing vegetables, get rid of junk food in the home, keep healthy foods in the home, and increase frequency of journaling.  Vanessa Sharp has agreed to follow-up with our clinic in 4 weeks.      Objective:   VITALS: Per patient if applicable, see vitals. GENERAL: Alert and in no acute distress. CARDIOPULMONARY: No increased WOB. Speaking in clear sentences.  PSYCH: Pleasant and cooperative. Speech normal rate and rhythm. Affect is appropriate. Insight and judgement are appropriate. Attention is focused, linear, and appropriate.  NEURO: Oriented as arrived to appointment on time with no prompting.   Attestation Statements:     This was prepared with the assistance of Engineer, civil (consulting).  Occasional wrong-word or sound-a-like substitutions may have occurred due to the inherent limitations of voice recognition   Corinna Capra, DO

## 2023-10-04 ENCOUNTER — Ambulatory Visit: Payer: BC Managed Care – PPO | Admitting: Bariatrics

## 2023-10-16 ENCOUNTER — Ambulatory Visit: Payer: 59 | Admitting: Bariatrics

## 2023-11-07 ENCOUNTER — Encounter: Payer: Self-pay | Admitting: Bariatrics

## 2023-11-07 ENCOUNTER — Ambulatory Visit: Payer: 59 | Admitting: Bariatrics

## 2023-11-07 VITALS — BP 124/84 | HR 84 | Temp 97.4°F | Ht 68.0 in | Wt 176.0 lb

## 2023-11-07 DIAGNOSIS — R632 Polyphagia: Secondary | ICD-10-CM

## 2023-11-07 DIAGNOSIS — Z6826 Body mass index (BMI) 26.0-26.9, adult: Secondary | ICD-10-CM

## 2023-11-07 DIAGNOSIS — E669 Obesity, unspecified: Secondary | ICD-10-CM | POA: Diagnosis not present

## 2023-11-07 DIAGNOSIS — E559 Vitamin D deficiency, unspecified: Secondary | ICD-10-CM | POA: Diagnosis not present

## 2023-11-07 MED ORDER — VITAMIN D (ERGOCALCIFEROL) 1.25 MG (50000 UNIT) PO CAPS
50000.0000 [IU] | ORAL_CAPSULE | ORAL | 0 refills | Status: DC
Start: 2023-11-07 — End: 2024-01-31

## 2023-11-07 MED ORDER — QSYMIA 7.5-46 MG PO CP24
ORAL_CAPSULE | ORAL | 0 refills | Status: DC
Start: 2023-11-07 — End: 2023-12-05

## 2023-11-07 NOTE — Progress Notes (Signed)
WEIGHT SUMMARY AND BIOMETRICS  Weight Lost Since Last Visit: 0  Weight Gained Since Last Visit: 4lb   Vitals Temp: (!) 97.4 F (36.3 C) BP: 124/84 Pulse Rate: 84 SpO2: 98 %   Anthropometric Measurements Height: 5\' 8"  (1.727 m) Weight: 176 lb (79.8 kg) BMI (Calculated): 26.77 Weight at Last Visit: 172lb Weight Lost Since Last Visit: 0 Weight Gained Since Last Visit: 4lb Starting Weight: 203lb Total Weight Loss (lbs): 27 lb (12.2 kg)   Body Composition  Body Fat %: 38.8 % Fat Mass (lbs): 68.2 lbs Muscle Mass (lbs): 102.4 lbs Total Body Water (lbs): 74.2 lbs Visceral Fat Rating : 7   Other Clinical Data Fasting: no Labs: no Today's Visit #: 21 Starting Date: 02/01/22    OBESITY Vanessa Sharp is here to discuss her progress with her obesity treatment plan along with follow-up of her obesity related diagnoses.    Nutrition Plan: the Category 2 plan - 70% adherence.  Current exercise: none  Interim History:  She is up 4 lbs since her last visit. She had to come off the Qsymia for 2 weeks.  Eating all of the food on the plan., Protein intake is as prescribed, Is not skipping meals, and Water intake is adequate.   Pharmacotherapy: Vanessa Sharp is on Qsymia 7.5/46 mg 1 capsule by mouth daily in am Adverse side effects: None Hunger is moderately controlled.  Cravings are moderately controlled.  Assessment/Plan:   1. Vitamin D deficiency Vitamin D Deficiency Vitamin D is at goal of 50.  Most recent vitamin D level was 55.5. She is on  prescription ergocalciferol 50,000 IU weekly. Lab Results  Component Value Date   VD25OH 55.5 05/24/2023   VD25OH 43.8 11/02/2022   VD25OH 75.6 05/09/2022    Plan: Refill prescription vitamin D 50,000 IU weekly.   2. Polyphagia Polyphagia Vanessa Sharp endorses excessive hunger.  Medication(s): Qsymia Effects of  medication:  moderately controlled. Cravings are moderately controlled.   Plan: Medication(s): Qsymia 7.5/46 mg 1 capsule by mouth daily in am Will increase water, protein and fiber to help assuage hunger.  Will minimize foods that have a high glucose index/load to minimize reactive hypoglycemia.  She will keep her water intake high.  She will increase her walking at work.     Generalized Obesity: Current BMI BMI (Calculated): 26.77   Pharmacotherapy Plan Continue and refill  Qsymia 7.5/46 mg 1 capsule by mouth daily in am  Vanessa Sharp is currently in the action stage of change. As such, her goal is to continue with weight loss efforts.  She has agreed to the Category 2 plan.  Exercise goals: For substantial health benefits, adults should do at least 150 minutes (2 hours and 30 minutes) a week of moderate-intensity, or 75 minutes (1 hour and 15 minutes) a week of vigorous-intensity aerobic physical activity, or an equivalent combination of moderate- and vigorous-intensity aerobic  activity. Aerobic activity should be performed in episodes of at least 10 minutes, and preferably, it should be spread throughout the week.  Behavioral modification strategies: increasing lean protein intake, decreasing simple carbohydrates , no meal skipping, increase water intake, better snacking choices, planning for success, and avoiding temptations.  Camera has agreed to follow-up with our clinic in 4 weeks.   Objective:   VITALS: Per patient if applicable, see vitals. GENERAL: Alert and in no acute distress. CARDIOPULMONARY: No increased WOB. Speaking in clear sentences.  PSYCH: Pleasant and cooperative. Speech normal rate and rhythm. Affect is appropriate. Insight and judgement are appropriate. Attention is focused, linear, and appropriate.  NEURO: Oriented as arrived to appointment on time with no prompting.   Attestation Statements:   This was prepared with the assistance of Engineer, civil (consulting).   Occasional wrong-word or sound-a-like substitutions may have occurred due to the inherent limitations of voice recognition   Corinna Capra, DO

## 2023-12-05 ENCOUNTER — Ambulatory Visit: Payer: 59 | Admitting: Bariatrics

## 2023-12-05 ENCOUNTER — Encounter: Payer: Self-pay | Admitting: Bariatrics

## 2023-12-05 VITALS — BP 126/81 | HR 91 | Temp 97.9°F | Ht 68.0 in | Wt 173.0 lb

## 2023-12-05 DIAGNOSIS — E7849 Other hyperlipidemia: Secondary | ICD-10-CM

## 2023-12-05 DIAGNOSIS — R632 Polyphagia: Secondary | ICD-10-CM | POA: Diagnosis not present

## 2023-12-05 DIAGNOSIS — Z6826 Body mass index (BMI) 26.0-26.9, adult: Secondary | ICD-10-CM | POA: Diagnosis not present

## 2023-12-05 DIAGNOSIS — E669 Obesity, unspecified: Secondary | ICD-10-CM | POA: Diagnosis not present

## 2023-12-05 MED ORDER — QSYMIA 7.5-46 MG PO CP24
ORAL_CAPSULE | ORAL | 0 refills | Status: DC
Start: 2023-12-05 — End: 2024-01-02

## 2023-12-05 NOTE — Progress Notes (Signed)
 WEIGHT SUMMARY AND BIOMETRICS  Weight Lost Since Last Visit: 3 lb  Weight Gained Since Last Visit: 0 lb   Vitals Temp: 97.9 F (36.6 C) BP: 126/81 Pulse Rate: 91 SpO2: 99 %   Anthropometric Measurements Height: 5\' 8"  (1.727 m) Weight: 173 lb (78.5 kg) BMI (Calculated): 26.31 Weight at Last Visit: 176 lb Weight Lost Since Last Visit: 3 lb Weight Gained Since Last Visit: 0 lb Starting Weight: 203lb Total Weight Loss (lbs): 30 lb (13.6 kg)   Body Composition  Body Fat %: 33.4 % Fat Mass (lbs): 57.8 lbs Muscle Mass (lbs): 109.6 lbs Total Body Water (lbs): 74.2 lbs Visceral Fat Rating : 6   Other Clinical Data Fasting: No Labs: No Today's Visit #: 22 Starting Date: 02/01/22    OBESITY Vanessa Sharp is here to discuss her progress with her obesity treatment plan along with follow-up of her obesity related diagnoses.    Nutrition Plan: the Category 2 plan - 98% adherence.  Current exercise: walking  Interim History:  She is down 3 lbs since her last visit.  Eating all of the food on the plan., Protein intake is as prescribed, Is not skipping meals, Water intake is adequate., Denies polyphagia, and Denies excessive cravings.   Pharmacotherapy: Vanessa Sharp is on Qsymia 7.5/46 mg 1 capsule by mouth daily in am Adverse side effects: None Hunger is moderately controlled.  Cravings are moderately controlled.  Assessment/Plan:   1. Polyphagia Polyphagia Vanessa Sharp endorses excessive hunger.  Medication(s): Qsymia Effects of medication:  moderately controlled. Cravings are moderately controlled.   Plan: Medication(s): Qsymia 7.5/46 mg 1 capsule by mouth daily in am Will increase water, protein and fiber to help assuage hunger.  Will minimize foods that have a high glucose index/load to minimize reactive hypoglycemia.    Hyperlipidemia LDL is not at  goal. Medication(s): No medications Cardiovascular risk factors: obesity (BMI >= 30 kg/m2) and sedentary lifestyle  Lab Results  Component Value Date   CHOL 174 05/24/2023   HDL 46 05/24/2023   LDLCALC 102 (H) 05/24/2023   TRIG 148 05/24/2023   CHOLHDL 2.8 11/09/2018   Lab Results  Component Value Date   ALT 10 05/24/2023   AST 12 05/24/2023   ALKPHOS 73 05/24/2023   BILITOT 0.6 05/24/2023   The ASCVD Risk score (Arnett DK, et al., 2019) failed to calculate for the following reasons:   Risk score cannot be calculated because patient has a medical history suggesting prior/existing ASCVD  Plan:  Will continue to walk and go to the gym about 3 x per week.  Will avoid all trans fats.  Will keep her healthy fats high.  Will read labels Will minimize saturated fats except the following: low fat meats in moderation, diary, and limited dark chocolate.  I   Generalized Obesity: Current BMI BMI (Calculated): 26.31   Pharmacotherapy Plan Continue and refill  Qsymia 7.5/46 mg 1 capsule by mouth daily in am  [name] is currently in the action stage of change. As such, her goal is to continue with weight loss efforts.  She has agreed to the Category 3 plan.  Exercise goals: All adults should avoid inactivity. Some physical activity is better than none, and adults who participate in any amount of physical activity gain some health benefits.  Behavioral modification strategies: increasing lean protein intake, decreasing simple carbohydrates , no meal skipping, meal planning , better snacking choices, planning for success, increasing vegetables, avoiding temptations, and keep healthy foods in the home.  [name] has agreed to follow-up with our clinic in 4 weeks.      Objective:   VITALS: Per patient if applicable, see vitals. GENERAL: Alert and in no acute distress. CARDIOPULMONARY: No increased WOB. Speaking in clear sentences.  PSYCH: Pleasant and cooperative. Speech normal rate  and rhythm. Affect is appropriate. Insight and judgement are appropriate. Attention is focused, linear, and appropriate.  NEURO: Oriented as arrived to appointment on time with no prompting.   Attestation Statements:    This was prepared with the assistance of Engineer, civil (consulting).  Occasional wrong-word or sound-a-like substitutions may have occurred due to the inherent limitations of voice recognition   Vanessa Capra, DO

## 2024-01-02 ENCOUNTER — Ambulatory Visit: Admitting: Bariatrics

## 2024-01-02 ENCOUNTER — Encounter: Payer: Self-pay | Admitting: Bariatrics

## 2024-01-02 DIAGNOSIS — E559 Vitamin D deficiency, unspecified: Secondary | ICD-10-CM | POA: Diagnosis not present

## 2024-01-02 DIAGNOSIS — R632 Polyphagia: Secondary | ICD-10-CM | POA: Diagnosis not present

## 2024-01-02 DIAGNOSIS — E669 Obesity, unspecified: Secondary | ICD-10-CM

## 2024-01-02 DIAGNOSIS — Z6826 Body mass index (BMI) 26.0-26.9, adult: Secondary | ICD-10-CM

## 2024-01-02 MED ORDER — PHENTERMINE-TOPIRAMATE ER 11.25-69 MG PO CP24: ORAL_CAPSULE | ORAL | 0 refills | Status: DC

## 2024-01-02 MED ORDER — QSYMIA 15-92 MG PO CP24: ORAL_CAPSULE | ORAL | 0 refills | Status: DC

## 2024-01-02 NOTE — Progress Notes (Signed)
 WEIGHT SUMMARY AND BIOMETRICS  Weight Lost Since Last Visit: 0  Weight Gained Since Last Visit: 1lb   Vitals Temp: 97.8 F (36.6 C) BP: 124/76 Pulse Rate: 86 SpO2: 99 %   Anthropometric Measurements Height: 5\' 8"  (1.727 m) Weight: 174 lb (78.9 kg) BMI (Calculated): 26.46 Weight at Last Visit: 173lb Weight Lost Since Last Visit: 0 Weight Gained Since Last Visit: 1lb Starting Weight: 203lb Total Weight Loss (lbs): 29 lb (13.2 kg)   Body Composition  Body Fat %: 38.4 % Fat Mass (lbs): 67 lbs Muscle Mass (lbs): 102.2 lbs Total Body Water (lbs): 72.8 lbs Visceral Fat Rating : 7   Other Clinical Data Fasting: no Labs: no Today's Visit #: 23 Starting Date: 02/01/22    OBESITY Vanessa Sharp is here to discuss her progress with her obesity treatment plan along with follow-up of her obesity related diagnoses.    Nutrition Plan: the Category 2 plan - 98% adherence.  Current exercise: walking  Interim History:  She is up 1 lb since her last visit.  Eating all of the food on the plan., Protein intake is as prescribed, Is not skipping meals, and Water intake is adequate.   Pharmacotherapy: Vanessa Sharp is on Qsymia 7.5/46 mg 1 capsule by mouth daily in am Adverse side effects: None Hunger is moderately controlled.  Cravings are moderately controlled.  Assessment/Plan:   1. Vitamin D deficiency Vitamin D Deficiency Vitamin D is at goal of 50.  Most recent vitamin D level was 55.5. She is on  prescription ergocalciferol 50,000 IU weekly. Lab Results  Component Value Date   VD25OH 55.5 05/24/2023   VD25OH 43.8 11/02/2022   VD25OH 75.6 05/09/2022    Plan: Refill prescription vitamin D 50,000 IU weekly.   2. Polyphagia Polyphagia Vanessa Sharp endorses excessive hunger.  Medication(s): Qsymia 7.5/46 mg daily.  Effects of medication:  moderately controlled.  Cravings are moderately controlled.   Plan: Medication(s): Increase Qsymia 11.25/69 mg 1 capsule by mouth daily in am and Qsymia 15/92 mg 1 capsule by mouth daily in am Will increase water, protein and fiber to help assuage hunger.  Will minimize foods that have a high glucose index/load to minimize reactive hypoglycemia.  She will increase her exercise (walking) in frequency and duration.     Generalized Obesity: Current BMI BMI (Calculated): 26.46   Pharmacotherapy Plan Continue and increase dose  Qsymia 11.25/69 mg 1 capsule by mouth daily in am and Qsymia 15/92 mg 1 capsule by mouth daily in am  Vanessa Sharp is currently in the action stage of change. As such, her goal is to continue with weight loss efforts.  She has agreed to the Category 2 plan.  Exercise goals: For substantial health benefits, adults should do at least 150 minutes (2 hours and 30 minutes) a week of moderate-intensity, or 75 minutes (1 hour and 15 minutes) a week of vigorous-intensity  aerobic physical activity, or an equivalent combination of moderate- and vigorous-intensity aerobic activity. Aerobic activity should be performed in episodes of at least 10 minutes, and preferably, it should be spread throughout the week.  Behavioral modification strategies: increasing lean protein intake, no meal skipping, meal planning , planning for success, and measure portion sizes.  Vanessa Sharp has agreed to follow-up with our clinic in 4 weeks.   Objective:   VITALS: Per patient if applicable, see vitals. GENERAL: Alert and in no acute distress. CARDIOPULMONARY: No increased WOB. Speaking in clear sentences.  PSYCH: Pleasant and cooperative. Speech normal rate and rhythm. Affect is appropriate. Insight and judgement are appropriate. Attention is focused, linear, and appropriate.  NEURO: Oriented as arrived to appointment on time with no prompting.   Attestation Statements:   This was prepared with the assistance of Restaurant manager, fast food.  Occasional wrong-word or sound-a-like substitutions may have occurred due to the inherent limitations of voice recognition   Vanessa Capra, DO

## 2024-01-31 ENCOUNTER — Encounter: Payer: Self-pay | Admitting: Bariatrics

## 2024-01-31 ENCOUNTER — Ambulatory Visit: Admitting: Bariatrics

## 2024-01-31 VITALS — BP 118/76 | HR 95 | Temp 97.9°F | Ht 68.0 in | Wt 169.0 lb

## 2024-01-31 DIAGNOSIS — E559 Vitamin D deficiency, unspecified: Secondary | ICD-10-CM | POA: Diagnosis not present

## 2024-01-31 DIAGNOSIS — Z6825 Body mass index (BMI) 25.0-25.9, adult: Secondary | ICD-10-CM

## 2024-01-31 DIAGNOSIS — E669 Obesity, unspecified: Secondary | ICD-10-CM

## 2024-01-31 DIAGNOSIS — R632 Polyphagia: Secondary | ICD-10-CM

## 2024-01-31 MED ORDER — VITAMIN D (ERGOCALCIFEROL) 1.25 MG (50000 UNIT) PO CAPS: 50000.0000 [IU] | ORAL_CAPSULE | ORAL | 0 refills | Status: DC

## 2024-01-31 NOTE — Progress Notes (Signed)
 WEIGHT SUMMARY AND BIOMETRICS  Weight Lost Since Last Visit: 5lb  Weight Gained Since Last Visit: 0   Vitals Temp: 97.9 F (36.6 C) BP: 118/76 Pulse Rate: 95 SpO2: 99 %   Anthropometric Measurements Height: 5\' 8"  (1.727 m) Weight: 169 lb (76.7 kg) BMI (Calculated): 25.7 Weight at Last Visit: 174lb Weight Lost Since Last Visit: 5lb Weight Gained Since Last Visit: 0 Starting Weight: 203lb Total Weight Loss (lbs): 34 lb (15.4 kg)   Body Composition  Body Fat %: 27.4 % Fat Mass (lbs): 46.4 lbs Muscle Mass (lbs): 117 lbs Total Body Water (lbs): 744 lbs Visceral Fat Rating : 5   Other Clinical Data Fasting: no Labs: no Today's Visit #: 24 Starting Date: 02/01/22    OBESITY Vanessa Sharp is here to discuss her progress with her obesity treatment plan along with follow-up of her obesity related diagnoses.    Nutrition Plan: the Category 2 plan - 98% adherence.  Current exercise: none  Interim History:  She is down another 5 lbs since her last visit.  Eating all of the food on the plan., Is not skipping meals, and Water intake is adequate.   Pharmacotherapy: Vanessa Sharp is on Qsymia  15/92 mg 1 capsule by mouth daily in am Adverse side effects: None Hunger is well controlled.  Cravings are moderately controlled.  Assessment/Plan:   Vitamin D  Deficiency Vitamin D  is at goal of 50.  Most recent vitamin D  level was 55.5. She is on  prescription ergocalciferol  50,000 IU weekly. Lab Results  Component Value Date   VD25OH 55.5 05/24/2023   VD25OH 43.8 11/02/2022   VD25OH 75.6 05/09/2022    Plan: Refill prescription vitamin D  50,000 IU weekly.   Polyphagia Vanessa Sharp endorses excessive hunger.  Medication(s): Qsymia   Effects of medication:  well controlled. Cravings are moderately controlled.   Plan: Medication(s): Qsymia  15/92 mg 1 capsule by mouth  daily in am Will increase water, protein and fiber to help assuage hunger.  Will minimize foods that have a high glucose index/load to minimize reactive hypoglycemia.  Will continue to cook meals at home.Aaron Aas Resume exercise.     Generalized Obesity: Current BMI BMI (Calculated): 25.7   Pharmacotherapy Plan Continue  Qsymia  15/92 mg 1 capsule by mouth daily in am  Vanessa Sharp is currently in the action stage of change. As such, her goal is to continue with weight loss efforts.  She has agreed to the Category 2 plan.  Exercise goals: For substantial health benefits, adults should do at least 150 minutes (2 hours and 30 minutes) a week of moderate-intensity, or 75 minutes (1 hour and 15 minutes) a week of vigorous-intensity aerobic physical activity, or an equivalent combination of moderate- and vigorous-intensity aerobic activity. Aerobic activity should be performed in episodes of at least 10 minutes, and preferably, it should be spread throughout the week.She will resume her walking routine at  work.   Research scientist (physical sciences) modification strategies: increasing lean protein intake, increase water intake, better snacking choices, planning for success, and mindful eating.  Story has agreed to follow-up with our clinic in 4 weeks.     Objective:   VITALS: Per patient if applicable, see vitals. GENERAL: Alert and in no acute distress. CARDIOPULMONARY: No increased WOB. Speaking in clear sentences.  PSYCH: Pleasant and cooperative. Speech normal rate and rhythm. Affect is appropriate. Insight and judgement are appropriate. Attention is focused, linear, and appropriate.  NEURO: Oriented as arrived to appointment on time with no prompting.   Wrap-up   This was prepared with the assistance of Engineer, civil (consulting).  Occasional wrong-word or sound-a-like substitutions may have occurred due to the inherent limitations of voice recognition   Kirk Peper, DO

## 2024-02-02 ENCOUNTER — Other Ambulatory Visit: Payer: Self-pay | Admitting: Family Medicine

## 2024-02-02 DIAGNOSIS — Z1231 Encounter for screening mammogram for malignant neoplasm of breast: Secondary | ICD-10-CM

## 2024-02-07 ENCOUNTER — Ambulatory Visit
Admission: RE | Admit: 2024-02-07 | Discharge: 2024-02-07 | Discharge status: Home / Self Care | Source: Ambulatory Visit

## 2024-02-07 DIAGNOSIS — Z1231 Encounter for screening mammogram for malignant neoplasm of breast: Secondary | ICD-10-CM

## 2024-02-22 ENCOUNTER — Ambulatory Visit: Admitting: Bariatrics

## 2024-02-22 ENCOUNTER — Encounter: Payer: Self-pay | Admitting: Bariatrics

## 2024-02-22 VITALS — BP 96/63 | HR 104 | Ht 68.0 in | Wt 169.0 lb

## 2024-02-22 DIAGNOSIS — Z6825 Body mass index (BMI) 25.0-25.9, adult: Secondary | ICD-10-CM

## 2024-02-22 DIAGNOSIS — E559 Vitamin D deficiency, unspecified: Secondary | ICD-10-CM | POA: Diagnosis not present

## 2024-02-22 DIAGNOSIS — R632 Polyphagia: Secondary | ICD-10-CM | POA: Diagnosis not present

## 2024-02-22 DIAGNOSIS — I10 Essential (primary) hypertension: Secondary | ICD-10-CM | POA: Diagnosis not present

## 2024-02-22 DIAGNOSIS — E7849 Other hyperlipidemia: Secondary | ICD-10-CM

## 2024-02-22 DIAGNOSIS — E669 Obesity, unspecified: Secondary | ICD-10-CM

## 2024-02-22 MED ORDER — VITAMIN D (ERGOCALCIFEROL) 1.25 MG (50000 UNIT) PO CAPS
50000.0000 [IU] | ORAL_CAPSULE | ORAL | 0 refills | Status: DC
Start: 2024-02-22 — End: 2024-04-25

## 2024-02-22 MED ORDER — QSYMIA 15-92 MG PO CP24: ORAL_CAPSULE | ORAL | 0 refills | Status: DC

## 2024-02-22 NOTE — Progress Notes (Signed)
 WEIGHT SUMMARY AND BIOMETRICS  Weight Lost Since Last Visit: 0  Weight Gained Since Last Visit: 0   Vitals BP: 96/63 Pulse Rate: (!) 104 SpO2: 100 %   Anthropometric Measurements Height: 5\' 8"  (1.727 m) Weight: 169 lb (76.7 kg) BMI (Calculated): 25.7 Weight at Last Visit: 169lb Weight Lost Since Last Visit: 0 Weight Gained Since Last Visit: 0 Starting Weight: 203lb Total Weight Loss (lbs): 34 lb (15.4 kg)   Body Composition  Body Fat %: 26.7 % Fat Mass (lbs): 45.2 lbs Muscle Mass (lbs): 117.8 lbs Total Body Water (lbs): 74 lbs Visceral Fat Rating : 5   Other Clinical Data Fasting: no Labs: yes Today's Visit #: 25 Starting Date: 02/01/22    OBESITY Angelise is here to discuss her progress with her obesity treatment plan along with follow-up of her obesity related diagnoses.    Nutrition Plan: the Category 2 plan - 95% adherence.  Current exercise: walking  Interim History:  Her weight remains the same.  Eating all of the food on the plan., Protein intake is as prescribed, Is not skipping meals, and Water intake is adequate.   Pharmacotherapy: Emari is on Qsymia  15/92 mg 1 capsule by mouth daily in am Adverse side effects: None Hunger is moderately controlled.  Cravings are moderately controlled.  Assessment/Plan:   Hypertension Hypertension well controlled.  Medication(s): none  BP Readings from Last 3 Encounters:  02/22/24 96/63  01/31/24 118/76  01/02/24 124/76   Lab Results  Component Value Date   CREATININE 0.68 05/24/2023   CREATININE 0.71 11/02/2022   CREATININE 0.63 05/09/2022   No results found for: "GFR"  Plan: Continue all antihypertensives at current dosages. No added salt. Will keep sodium content to 1,500 mg or less per day.   Will contact her PCP if she has hypotensive symptoms.   Polyphagia Jasmen endorses  excessive hunger.  Medication(s): Qsymia  Effects of medication (appetite):  moderately controlled. Cravings are moderately controlled.   Plan: Medication(s): Qsymia  15/92 mg 1 capsule by mouth daily in am Will increase water, protein and fiber to help assuage hunger.  Will minimize foods that have a high glucose index/load to minimize reactive hypoglycemia.   Vitamin D  Deficiency Vitamin D  is at goal of 50.  Most recent vitamin D  level was 55.5. She is on  prescription ergocalciferol  50,000 IU weekly. Lab Results  Component Value Date   VD25OH 55.5 05/24/2023   VD25OH 43.8 11/02/2022   VD25OH 75.6 05/09/2022    Plan: Refill prescription vitamin D  50,000 IU weekly.   Labs done today (CMP, Lipids, HgbA1c, insulin , vitamin D ).    Generalized Obesity: Current BMI BMI (Calculated): 25.7   Pharmacotherapy Plan Continue and refill  Qsymia  15/92 mg 1 capsule by mouth daily in am  Lewis is currently in the action stage of change. As such, her goal is to continue with weight  loss efforts.  She has agreed to the Category 2 plan.  Exercise goals: For substantial health benefits, adults should do at least 150 minutes (2 hours and 30 minutes) a week of moderate-intensity, or 75 minutes (1 hour and 15 minutes) a week of vigorous-intensity aerobic physical activity, or an equivalent combination of moderate- and vigorous-intensity aerobic activity. Aerobic activity should be performed in episodes of at least 10 minutes, and preferably, it should be spread throughout the week.  Behavioral modification strategies: increasing lean protein intake, avoiding temptations, keep healthy foods in the home, weigh protein portions, measure portion sizes, and mindful eating.  Elika has agreed to follow-up with our clinic in 4 weeks.    Objective:   VITALS: Per patient if applicable, see vitals. GENERAL: Alert and in no acute distress. CARDIOPULMONARY: No increased WOB. Speaking in clear sentences.   PSYCH: Pleasant and cooperative. Speech normal rate and rhythm. Affect is appropriate. Insight and judgement are appropriate. Attention is focused, linear, and appropriate.  NEURO: Oriented as arrived to appointment on time with no prompting.   Attestation Statements:   This was prepared with the assistance of Engineer, civil (consulting).  Occasional wrong-word or sound-a-like substitutions may have occurred due to the inherent limitations of voice recognition   Kirk Peper, DO

## 2024-02-24 LAB — COMPREHENSIVE METABOLIC PANEL WITH GFR
ALT: 12 IU/L (ref 0–32)
AST: 15 IU/L (ref 0–40)
Albumin: 4.9 g/dL (ref 3.9–4.9)
Alkaline Phosphatase: 74 IU/L (ref 44–121)
BUN/Creatinine Ratio: 13 (ref 9–23)
BUN: 11 mg/dL (ref 6–24)
Bilirubin Total: 0.6 mg/dL (ref 0.0–1.2)
CO2: 21 mmol/L (ref 20–29)
Calcium: 8.5 mg/dL — ABNORMAL LOW (ref 8.7–10.2)
Chloride: 101 mmol/L (ref 96–106)
Creatinine, Ser: 0.86 mg/dL (ref 0.57–1.00)
Globulin, Total: 2.1 g/dL (ref 1.5–4.5)
Glucose: 82 mg/dL (ref 70–99)
Potassium: 3.8 mmol/L (ref 3.5–5.2)
Sodium: 140 mmol/L (ref 134–144)
Total Protein: 7 g/dL (ref 6.0–8.5)
eGFR: 84 mL/min/{1.73_m2} (ref >59)

## 2024-02-24 LAB — LIPID PANEL WITH LDL/HDL RATIO
Cholesterol, Total: 169 mg/dL (ref 100–199)
HDL: 53 mg/dL (ref >39)
LDL Chol Calc (NIH): 86 mg/dL (ref 0–99)
LDL/HDL Ratio: 1.6 ratio (ref 0.0–3.2)
Triglycerides: 176 mg/dL — ABNORMAL HIGH (ref 0–149)
VLDL Cholesterol Cal: 30 mg/dL (ref 5–40)

## 2024-02-24 LAB — VITAMIN D 25 HYDROXY (VIT D DEFICIENCY, FRACTURES): Vit D, 25-Hydroxy: 69.8 ng/mL (ref 30.0–100.0)

## 2024-02-24 LAB — INSULIN, RANDOM: INSULIN: 7.1 u[IU]/mL (ref 2.6–24.9)

## 2024-03-06 ENCOUNTER — Ambulatory Visit: Payer: Self-pay | Admitting: Bariatrics

## 2024-03-22 ENCOUNTER — Encounter (HOSPITAL_COMMUNITY): Payer: Self-pay | Admitting: Interventional Radiology

## 2024-03-25 ENCOUNTER — Encounter: Payer: Self-pay | Admitting: Bariatrics

## 2024-03-25 ENCOUNTER — Ambulatory Visit: Admitting: Bariatrics

## 2024-03-25 DIAGNOSIS — Z6825 Body mass index (BMI) 25.0-25.9, adult: Secondary | ICD-10-CM | POA: Diagnosis not present

## 2024-03-25 DIAGNOSIS — R632 Polyphagia: Secondary | ICD-10-CM

## 2024-03-25 DIAGNOSIS — E559 Vitamin D deficiency, unspecified: Secondary | ICD-10-CM

## 2024-03-25 DIAGNOSIS — E669 Obesity, unspecified: Secondary | ICD-10-CM

## 2024-03-25 MED ORDER — PHENTERMINE-TOPIRAMATE ER 15-92 MG PO CP24: ORAL_CAPSULE | ORAL | 0 refills | Status: DC

## 2024-03-25 NOTE — Progress Notes (Signed)
 WEIGHT SUMMARY AND BIOMETRICS  Weight Lost Since Last Visit: 0  Weight Gained Since Last Visit: 1lb   Vitals Temp: 97.9 F (36.6 C) BP: 101/68 Pulse Rate: (!) 111 SpO2: 99 %   Anthropometric Measurements Height: 5' 8 (1.727 m) Weight: 170 lb (77.1 kg) BMI (Calculated): 25.85 Weight at Last Visit: 169lb Weight Lost Since Last Visit: 0 Weight Gained Since Last Visit: 1lb Starting Weight: 203lb Total Weight Loss (lbs): 33 lb (15 kg)   Body Composition  Body Fat %: 31.1 % Fat Mass (lbs): 53 lbs Muscle Mass (lbs): 111.6 lbs Total Body Water (lbs): 74 lbs Visceral Fat Rating : 6   Other Clinical Data Fasting: no Labs: no Today's Visit #: 26 Starting Date: 02/01/22    OBESITY Vanessa Sharp is here to discuss her progress with her obesity treatment plan along with follow-up of her obesity related diagnoses.    Nutrition Plan: the Category 2 plan - 95% adherence.  Current exercise: walking  Interim History:  She is up 1 lb since her last visit.  Eating all of the food on the plan., Protein intake is as prescribed, Is not skipping meals, Not journaling consistently., Water intake is adequate., and Denies polyphagia   Pharmacotherapy: Vanessa Sharp is on Qsymia  15/92 mg 1 capsule by mouth daily in am Adverse side effects: None Hunger is well controlled.  Cravings are well controlled.  Assessment/Plan:   Hypocalcemia (mild):   She is not taking a calcium  supplement on a regular basis.  She is taking vitamin D .   Plan: She will start a calcium  supplement 1000 to 1200 mg of calcium  daily.  Will recheck her calcium  level in the future.  Polyphagia Vanessa Sharp endorses excessive hunger.  Medication(s): Qsymia  Effects of medication:  well controlled. Cravings are well controlled.   Plan: Medication(s): Qsymia  15/92 mg 1 capsule by mouth daily in am Will  increase water, protein and fiber to help assuage hunger.  Will minimize foods that have a high glucose index/load to minimize reactive hypoglycemia.   Vitamin D  Deficiency Vitamin D  is at goal of 50.  Most recent vitamin D  level was 69.8. She is on  prescription ergocalciferol  50,000 IU every 14 days. Lab Results  Component Value Date   VD25OH 69.8 02/22/2024   VD25OH 55.5 05/24/2023   VD25OH 43.8 11/02/2022    Plan: Continue prescription vitamin D  50,000 IU every 2 weeks.    Generalized Obesity: Current BMI BMI (Calculated): 25.85   Pharmacotherapy Plan Continue and refill  Qsymia  15/92 mg 1 capsule by mouth daily in am  Vanessa Sharp is currently in the action stage of change. As such, her goal is to continue with weight loss efforts.  She has agreed to the Category 2 plan.  Exercise goals: All adults should avoid inactivity. Some physical activity is better than none, and adults who participate in any amount of physical  activity gain some health benefits.  Behavioral modification strategies: increasing lean protein intake, no meal skipping, meal planning , better snacking choices, planning for success, increasing vegetables, avoiding temptations, keep healthy foods in the home, work on smaller portions, and mindful eating.  Vanessa Sharp has agreed to follow-up with our clinic in 4 weeks.    Objective:   VITALS: Per patient if applicable, see vitals. GENERAL: Alert and in no acute distress. CARDIOPULMONARY: No increased WOB. Speaking in clear sentences.  PSYCH: Pleasant and cooperative. Speech normal rate and rhythm. Affect is appropriate. Insight and judgement are appropriate. Attention is focused, linear, and appropriate.  NEURO: Oriented as arrived to appointment on time with no prompting.   Attestation Statements:   This was prepared with the assistance of Engineer, civil (consulting).  Occasional wrong-word or sound-a-like substitutions may have occurred due to the inherent  limitations of voice recognition   Clayborne Daring, DO

## 2024-04-25 ENCOUNTER — Encounter: Payer: Self-pay | Admitting: Bariatrics

## 2024-04-25 ENCOUNTER — Ambulatory Visit: Admitting: Bariatrics

## 2024-04-25 VITALS — BP 114/72 | HR 140 | Temp 97.6°F | Ht 68.0 in | Wt 168.0 lb

## 2024-04-25 DIAGNOSIS — Z6825 Body mass index (BMI) 25.0-25.9, adult: Secondary | ICD-10-CM

## 2024-04-25 DIAGNOSIS — E669 Obesity, unspecified: Secondary | ICD-10-CM

## 2024-04-25 DIAGNOSIS — E559 Vitamin D deficiency, unspecified: Secondary | ICD-10-CM

## 2024-04-25 DIAGNOSIS — R632 Polyphagia: Secondary | ICD-10-CM | POA: Diagnosis not present

## 2024-04-25 MED ORDER — VITAMIN D (ERGOCALCIFEROL) 1.25 MG (50000 UNIT) PO CAPS: 50000.0000 [IU] | ORAL_CAPSULE | ORAL | 0 refills | Status: AC

## 2024-04-25 MED ORDER — PHENTERMINE-TOPIRAMATE ER 7.5-46 MG PO CP24
ORAL_CAPSULE | ORAL | 0 refills | Status: DC
Start: 2024-04-25 — End: 2024-05-29

## 2024-04-25 NOTE — Progress Notes (Signed)
 WEIGHT SUMMARY AND BIOMETRICS  Weight Lost Since Last Visit: 2lb  Weight Gained Since Last Visit: 0   Vitals Temp: 97.6 F (36.4 C) BP: 114/72 Pulse Rate: (!) 140 SpO2: 100 %   Anthropometric Measurements Height: 5' 8 (1.727 m) Weight: 168 lb (76.2 kg) BMI (Calculated): 25.55 Weight at Last Visit: 170lb Weight Lost Since Last Visit: 2lb Weight Gained Since Last Visit: 0 Starting Weight: 20lb Total Weight Loss (lbs): 35 lb (15.9 kg)   Body Composition  Body Fat %: 31.8 % Fat Mass (lbs): 53.6 lbs Muscle Mass (lbs): 109 lbs Total Body Water (lbs): 72.2 lbs Visceral Fat Rating : 6   Other Clinical Data Fasting: no Labs: no Today's Visit #: 27 Starting Date: 02/01/22    OBESITY Jamillia is here to discuss her progress with her obesity treatment plan along with follow-up of her obesity related diagnoses.    Nutrition Plan: the Category 2 plan - 99% adherence.  Current exercise: none  Interim History:  She is down 2 pounds since her last visit.  She is on Qsymia .  She states that her nephrologist wants her to come off of the Qsymia  secondary to a history of renal calculi and she states that her urinary pH is not in balance.   Eating all of the food on the plan., Protein intake is as prescribed, Is not skipping meals, and Water intake is adequate.   Pharmacotherapy: Vi is on Qsymia  15/92 mg 1 capsule by mouth daily in am Adverse side effects: None Hunger is moderately controlled.  Cravings are moderately controlled.  Assessment/Plan:   Vitamin D  Deficiency Vitamin D  is at goal of 50.  Most recent vitamin D  level was 69.8. She is on  prescription ergocalciferol  50,000 IU weekly. Lab Results  Component Value Date   VD25OH 69.8 02/22/2024   VD25OH 55.5 05/24/2023   VD25OH 43.8 11/02/2022    Plan: Continue prescription vitamin D  50,000  IU weekly.   Polyphagia Shalinda endorses excessive hunger.  Medication(s): Qsymia  15-92 mg Effects of medication:  moderately controlled. Cravings are moderately controlled.   Plan: Medication(s): Qsymia  7.5/46 mg 1 capsule by mouth daily in am.  Will decrease her Qsymia  from 15/92 mg to 7.5/46 mg to better comply with her nephrologist recommendations.  At her next visit,  we may stop the Qsymia .  I did not want to stop the Qsymia  abruptly and stimulate her appetite.  If we can no longer use the Qsymia ,  we may have to look at other alternatives.  She had been on Wegovy  in the past and had done well but her insurance stopped coverage. Will increase water, protein and fiber to help assuage hunger.  Will minimize foods that have a high glucose index/load to minimize reactive hypoglycemia.    Generalized Obesity: Current BMI BMI (Calculated): 25.55   Pharmacotherapy Plan Continue and decrease dose  Qsymia  7.5/46 mg 1 capsule by mouth daily in am  Lindia is currently in the action stage of change. As such, her goal is to continue with weight loss efforts.  She has agreed to the Category 2 plan.  Exercise goals: All adults should avoid inactivity. Some physical activity is better than none, and adults who participate in any amount of physical activity gain some health benefits.  Behavioral modification strategies: increasing lean protein intake, decreasing simple carbohydrates , no meal skipping, decrease eating out, meal planning , increase water intake, better snacking choices, planning for success, increasing vegetables, avoiding temptations, keep healthy foods in the home, weigh protein portions, measure portion sizes, and mindful eating.  Daliana has agreed to follow-up with our clinic in 4 weeks.    Objective:   VITALS: Per patient if applicable, see vitals. GENERAL: Alert and in no acute distress. CARDIOPULMONARY: No increased WOB. Speaking in clear sentences.  PSYCH: Pleasant and  cooperative. Speech normal rate and rhythm. Affect is appropriate. Insight and judgement are appropriate. Attention is focused, linear, and appropriate.  NEURO: Oriented as arrived to appointment on time with no prompting.   Attestation Statements:   This was prepared with the assistance of Engineer, civil (consulting).  Occasional wrong-word or sound-a-like substitutions may have occurred due to the inherent limitations of voice recognition

## 2024-05-29 ENCOUNTER — Ambulatory Visit: Admitting: Bariatrics

## 2024-05-29 ENCOUNTER — Telehealth (INDEPENDENT_AMBULATORY_CARE_PROVIDER_SITE_OTHER): Payer: Self-pay | Admitting: *Deleted

## 2024-05-29 ENCOUNTER — Encounter: Payer: Self-pay | Admitting: Bariatrics

## 2024-05-29 VITALS — BP 112/75 | HR 115 | Temp 97.9°F | Ht 68.0 in | Wt 171.0 lb

## 2024-05-29 DIAGNOSIS — E559 Vitamin D deficiency, unspecified: Secondary | ICD-10-CM | POA: Diagnosis not present

## 2024-05-29 DIAGNOSIS — R632 Polyphagia: Secondary | ICD-10-CM

## 2024-05-29 DIAGNOSIS — E669 Obesity, unspecified: Secondary | ICD-10-CM | POA: Diagnosis not present

## 2024-05-29 DIAGNOSIS — Z6826 Body mass index (BMI) 26.0-26.9, adult: Secondary | ICD-10-CM

## 2024-05-29 MED ORDER — PHENTERMINE-TOPIRAMATE ER 7.5-46 MG PO CP24: ORAL_CAPSULE | ORAL | 0 refills | Status: DC

## 2024-05-29 NOTE — Progress Notes (Signed)
 WEIGHT SUMMARY AND BIOMETRICS  Weight Lost Since Last Visit: 0  Weight Gained Since Last Visit: 3lb   Vitals Temp: 97.9 F (36.6 C) BP: 112/75 Pulse Rate: (!) 115 SpO2: 99 %   Anthropometric Measurements Height: 5' 8 (1.727 m) Weight: 171 lb (77.6 kg) BMI (Calculated): 26.01 Weight at Last Visit: 168lb Weight Lost Since Last Visit: 0 Weight Gained Since Last Visit: 3lb Starting Weight: 203lb Total Weight Loss (lbs): 32 lb (14.5 kg)   Body Composition  Body Fat %: 27.4 % Fat Mass (lbs): 46.8 lbs Muscle Mass (lbs): 118 lbs Total Body Water (lbs): 74.8 lbs Visceral Fat Rating : 5   Other Clinical Data Fasting: no Labs: no Today's Visit #: 28 Starting Date: 02/01/22    OBESITY Vanessa Sharp is here to discuss her progress with her obesity treatment plan along with follow-up of her obesity related diagnoses.    Nutrition Plan: the Category 2 plan - 95% adherence.  Current exercise: walking  Interim History:  She is up 3 lbs since her last visit.  Eating all of the food on the plan., Protein intake is as prescribed, Is not skipping meals, and Water intake is adequate.   Pharmacotherapy: Vanessa Sharp is on Qsymia  7.5/46 mg 1 capsule by mouth daily in am Adverse side effects: None Hunger is moderately controlled.  Cravings are moderately controlled.  Assessment/Plan:   Vitamin D  Deficiency Vitamin D  is at goal of 50.  Most recent vitamin D  level was 69.8. She is on  prescription ergocalciferol  50,000 IU every 14 days. Lab Results  Component Value Date   VD25OH 69.8 02/22/2024   VD25OH 55.5 05/24/2023   VD25OH 43.8 11/02/2022    Plan: Continue prescription vitamin D  50,000 international units every 14 days.   Polyphagia Vanessa Sharp endorses excessive hunger. Qsymia  seems to be working well for her, but she is concerned about the possibly of renal  calculi with the Topamax . She want to know about other possibilities.  Medication(s): Qsymia  7.5/46 mg daily  Effects of medication (appetite):  moderately controlled. Cravings are moderately controlled.   Plan: Medication(s): Qsymia  7.5/46 mg 1 capsule by mouth daily in am Will increase water, protein and fiber to help assuage hunger.  We discussed Topamax  independent from Qsymia ,  along with Wellbutrin and Naltrexone, Contrave and other possibilities.  She does not have coverage for GLP-1's.  She denies a history of any seizure disorders but has had an episode of kidney stones x 1.  We discussed the risk and benefits of the medications listed above.  She wants to do some independent research and we can discuss at her next visit. Will minimize foods that have a high glucose index/load to minimize reactive hypoglycemia.  Will continue to walk while at work. She will increase her resistance training.  Will cook more at home.     Generalized Obesity: Current BMI BMI (  Calculated): 26.01   Pharmacotherapy Plan Continue and refill  Qsymia  7.5/46 mg 1 capsule by mouth daily in am  Vanessa Sharp is currently in the action stage of change. As such, her goal is to continue with weight loss efforts.  She has agreed to the Category 2 plan.  Exercise goals: All adults should avoid inactivity. Some physical activity is better than none, and adults who participate in any amount of physical activity gain some health benefits. She will continue to walk at work.   Behavioral modification strategies: increasing lean protein intake, no meal skipping, increase water intake, better snacking choices, planning for success, increasing vegetables, keep healthy foods in the home, weigh protein portions, measure portion sizes, work on smaller portions, and mindful eating.  Vanessa Sharp has agreed to follow-up with our clinic in 4 weeks.      Objective:   VITALS: Per patient if applicable, see vitals. GENERAL: Alert and in  no acute distress. CARDIOPULMONARY: No increased WOB. Speaking in clear sentences.  PSYCH: Pleasant and cooperative. Speech normal rate and rhythm. Affect is appropriate. Insight and judgement are appropriate. Attention is focused, linear, and appropriate.  NEURO: Oriented as arrived to appointment on time with no prompting.   Attestation Statements:   This was prepared with the assistance of Engineer, civil (consulting).  Occasional wrong-word or sound-a-like substitutions may have occurred due to the inherent limitations of voice recognition   Clayborne Daring, DO

## 2024-05-29 NOTE — Telephone Encounter (Signed)
 Vanessa Sharp (Key: [id number])  [employer] has not yet replied to your PA request. Depending on the information you've provided, additional questions may be returned by the plan. You may close this dialog, return to your dashboard, and perform other tasks.  To check for an update later, open this request again from your dashboard.  If [employer] has not replied to your request within 24 hours please contact [employer] at [phone].

## 2024-05-30 NOTE — Telephone Encounter (Signed)
 PA for Phentermine -Topiramate  ER 7.5-46 has been approved. PA is now complete.      Outcome Approved on September 3 by [facility] NCPDP [id number] Your PA request has been approved. Additional information will be provided in the approval communication. (Message 1145) Effective Date: 05/29/2024 Authorization Expiration Date: 05/29/2025

## 2024-06-27 ENCOUNTER — Ambulatory Visit: Admitting: Bariatrics

## 2024-06-27 ENCOUNTER — Encounter: Payer: Self-pay | Admitting: Bariatrics

## 2024-06-27 VITALS — BP 122/67 | HR 90 | Temp 97.8°F | Ht 68.0 in | Wt 176.0 lb

## 2024-06-27 DIAGNOSIS — E669 Obesity, unspecified: Secondary | ICD-10-CM | POA: Diagnosis not present

## 2024-06-27 DIAGNOSIS — Z6826 Body mass index (BMI) 26.0-26.9, adult: Secondary | ICD-10-CM

## 2024-06-27 DIAGNOSIS — E785 Hyperlipidemia, unspecified: Secondary | ICD-10-CM | POA: Diagnosis not present

## 2024-06-27 DIAGNOSIS — E7849 Other hyperlipidemia: Secondary | ICD-10-CM

## 2024-06-27 DIAGNOSIS — R632 Polyphagia: Secondary | ICD-10-CM | POA: Diagnosis not present

## 2024-06-27 MED ORDER — PHENTERMINE-TOPIRAMATE ER 7.5-46 MG PO CP24: ORAL_CAPSULE | ORAL | 0 refills | Status: DC

## 2024-06-27 NOTE — Progress Notes (Signed)
 WEIGHT SUMMARY AND BIOMETRICS  Weight Lost Since Last Visit: 0  Weight Gained Since Last Visit: 5lb   Vitals Temp: 97.8 F (36.6 C) BP: 122/67 Pulse Rate: 90 SpO2: 99 %   Anthropometric Measurements Height: 5' 8 (1.727 m) Weight: 176 lb (79.8 kg) BMI (Calculated): 26.77 Weight at Last Visit: 171lb Weight Lost Since Last Visit: 0 Weight Gained Since Last Visit: 5lb Starting Weight: 203lb Total Weight Loss (lbs): 27 lb (12.2 kg)   Body Composition  Body Fat %: 37.7 % Fat Mass (lbs): 66.4 lbs Muscle Mass (lbs): 104.2 lbs Total Body Water (lbs): 73.58 lbs Visceral Fat Rating : 7   Other Clinical Data Fasting: no Labs: no Today's Visit #: 65 Starting Date: 02/01/22    OBESITY Vanessa Sharp is here to discuss her progress with her obesity treatment plan along with follow-up of her obesity related diagnoses.    Nutrition Plan: the Category 2 plan - 95% adherence.  Current exercise: walking  Interim History:  She went up 5 lbs since her last visit.  Protein intake is as prescribed, Is not skipping meals, and Water intake is adequate.   Pharmacotherapy: Vanessa Sharp is on Qsymia  7.5/46 mg 1 capsule by mouth daily in am Adverse side effects: None Hunger is moderately controlled (hunger increased) Cravings are moderately controlled.  Assessment/Plan:   Vanessa Sharp endorses excessive hunger.  Medication(s): Qsymia  Effects of medication:  moderately controlled. Cravings are moderately controlled.   Plan: Medication(s): Qsymia  7.5/46 mg 1 capsule by mouth daily in am (will change from generic Qsymia  back to the brand-name due to lack of effectiveness). Will increase water, protein and fiber to help assuage hunger.  Will minimize foods that have a high glucose index/load to minimize reactive hypoglycemia.   Hyperlipidemia Medication(s): none.   Cardiovascular risk factors: dyslipidemia and sedentary lifestyle  Lab Results  Component Value Date   CHOL 169 02/22/2024   HDL 53 02/22/2024   LDLCALC 86 02/22/2024   TRIG 176 (H) 02/22/2024   CHOLHDL 2.8 11/09/2018   Lab Results  Component Value Date   ALT 12 02/22/2024   AST 15 02/22/2024   ALKPHOS 74 02/22/2024   BILITOT 0.6 02/22/2024   The ASCVD Risk score (Arnett DK, et al., 2019) failed to calculate for the following reasons:   Risk score cannot be calculated because patient has a medical history suggesting prior/existing ASCVD    Generalized Obesity: Current BMI BMI (Calculated): 26.77   Pharmacotherapy Plan Continue and refill  Qsymia  7.5/46 mg 1 capsule by mouth daily in am (brand), generic not as effective.   Vanessa Sharp is currently in the action stage of change. As such, her goal is to continue with weight loss efforts.  She has agreed to the Category 2 plan.  Exercise goals: All adults should avoid inactivity. Some physical activity is better than none, and adults who participate in any  amount of physical activity gain some health benefits.  Behavioral modification strategies: increasing lean protein intake, decreasing simple carbohydrates , no meal skipping, meal planning , increase water intake, better snacking choices, planning for success, avoiding temptations, keep healthy foods in the home, increase frequency of journaling, weigh protein portions, and measure portion sizes.  Vanessa Sharp has agreed to follow-up with our clinic in 4 weeks.       Objective:   VITALS: Per patient if applicable, see vitals. GENERAL: Alert and in no acute distress. CARDIOPULMONARY: No increased WOB. Speaking in clear sentences.  PSYCH: Pleasant and cooperative. Speech normal rate and rhythm. Affect is appropriate. Insight and judgement are appropriate. Attention is focused, linear, and appropriate.  NEURO: Oriented as arrived to appointment on time with no prompting.    Attestation Statements:   This was prepared with the assistance of Engineer, civil (consulting).  Occasional wrong-word or sound-a-like substitutions may have occurred due to the inherent limitations of voice recognition   Clayborne Daring, DO

## 2024-07-25 ENCOUNTER — Encounter: Payer: Self-pay | Admitting: Bariatrics

## 2024-07-25 ENCOUNTER — Ambulatory Visit: Admitting: Bariatrics

## 2024-07-25 VITALS — BP 121/82 | HR 100 | Temp 97.8°F | Ht 68.0 in | Wt 175.0 lb

## 2024-07-25 DIAGNOSIS — Z6826 Body mass index (BMI) 26.0-26.9, adult: Secondary | ICD-10-CM | POA: Diagnosis not present

## 2024-07-25 DIAGNOSIS — E559 Vitamin D deficiency, unspecified: Secondary | ICD-10-CM

## 2024-07-25 DIAGNOSIS — R632 Polyphagia: Secondary | ICD-10-CM

## 2024-07-25 DIAGNOSIS — E669 Obesity, unspecified: Secondary | ICD-10-CM | POA: Diagnosis not present

## 2024-07-25 MED ORDER — TIRZEPATIDE-WEIGHT MANAGEMENT 2.5 MG/0.5ML ~~LOC~~ SOLN: 2.5000 mg | SUBCUTANEOUS | 0 refills | Status: DC

## 2024-07-25 NOTE — Progress Notes (Signed)
 WEIGHT SUMMARY AND BIOMETRICS  Weight Lost Since Last Visit: 1lb  Weight Gained Since Last Visit: 0   Vitals Temp: 97.8 F (36.6 C) BP: 121/82 Pulse Rate: 100 SpO2: 99 %   Anthropometric Measurements Height: 5' 8 (1.727 m) Weight: 175 lb (79.4 kg) BMI (Calculated): 26.61 Weight at Last Visit: 176lb Weight Lost Since Last Visit: 1lb Weight Gained Since Last Visit: 0 Starting Weight: 203lb Total Weight Loss (lbs): 28 lb (12.7 kg)   Body Composition  Body Fat %: 37.5 % Fat Mass (lbs): 65.8 lbs Muscle Mass (lbs): 103.8 lbs Total Body Water (lbs): 72.8 lbs Visceral Fat Rating : 7   Other Clinical Data Fasting: no Labs: no Today's Visit #: 30 Starting Date: 02/01/22    OBESITY Vanessa Sharp is here to discuss her progress with her obesity treatment plan along with follow-up of her obesity related diagnoses.    Nutrition Plan: the Category 2 plan - 98% adherence.  Current exercise: walking  Interim History:  She is down another lb since her last visit. She states that the Qsymia  brand is better than the generic.  Eating all of the food on the plan., Protein intake is as prescribed, and Water intake is adequate.   Pharmacotherapy: Vanessa Sharp is on Qsymia  Adverse side effects: None Hunger is moderately controlled.  Cravings are moderately controlled.  Assessment/Plan:   Vanessa Sharp endorses excessive hunger.  Medication(s): Qsymia  Effects of medication:  moderately controlled. Cravings are moderately controlled.   Plan: Medication(s): Vanessa Sharp 2.5 mg SQ weekly Will increase water, protein and fiber to help assuage hunger.  Will minimize foods that have a high glucose index/load to minimize reactive hypoglycemia.   Vanessa Sharp denies personal or family history of thyroid cancer, history of pancreatitis, or current cholelithiasis. Vanessa Sharp was  informed of the most common side effects (nausea, constipation, diarrhea). She was given GLP-1 information sheet.  Patient informed to watch for possible symptoms, such as a lump or swelling in the neck, hoarseness, trouble swallowing, or shortness of breath. If you have any of these symptoms, tell your healthcare provide.   She has been placed on a 500 calorie deficit diet.  She has been advised to exercise at least 150 minutes per week, both cardio and resistance.    Information sheet on injection technique.     Generalized Obesity: Current BMI BMI (Calculated): 26.61   Pharmacotherapy Plan Start  Vanessa Sharp 2.5 mg SQ weekly  Vanessa Sharp is currently in the action stage of change. As such, her goal is to continue with weight loss efforts.  She has agreed to the Category 2 plan.  Exercise goals: For substantial health benefits, adults should do at least 150 minutes (2 hours and 30 minutes) a week of moderate-intensity, or 75 minutes (1 hour and 15 minutes) a week of vigorous-intensity aerobic physical activity, or an equivalent combination of moderate- and vigorous-intensity aerobic activity.  Aerobic activity should be performed in episodes of at least 10 minutes, and preferably, it should be spread throughout the week.  Behavioral modification strategies: increasing lean protein intake, no meal skipping, decrease eating out, meal planning , increase water intake, better snacking choices, planning for success, and increasing vegetables.  Vanessa Sharp has agreed to follow-up with our clinic in 4 weeks.     Objective:   VITALS: Per patient if applicable, see vitals. GENERAL: Alert and in no acute distress. CARDIOPULMONARY: No increased WOB. Speaking in clear sentences.  PSYCH: Pleasant and cooperative. Speech normal rate and rhythm. Affect is appropriate. Insight and judgement are appropriate. Attention is focused, linear, and appropriate.  NEURO: Oriented as arrived to appointment on time with no  prompting.   Attestation Statements:   This was prepared with the assistance of Engineer, Civil (consulting).  Occasional wrong-word or sound-a-like substitutions may have occurred due to the inherent limitations of voice recognition   Clayborne Daring, DO

## 2024-08-14 ENCOUNTER — Other Ambulatory Visit: Payer: Self-pay | Admitting: Bariatrics

## 2024-08-29 ENCOUNTER — Ambulatory Visit: Admitting: Bariatrics

## 2024-09-03 ENCOUNTER — Encounter: Payer: Self-pay | Admitting: Bariatrics

## 2024-09-03 ENCOUNTER — Ambulatory Visit: Admitting: Bariatrics

## 2024-09-03 VITALS — BP 141/77 | HR 92 | Ht 68.0 in | Wt 172.0 lb

## 2024-09-03 DIAGNOSIS — R632 Polyphagia: Secondary | ICD-10-CM

## 2024-09-03 DIAGNOSIS — E559 Vitamin D deficiency, unspecified: Secondary | ICD-10-CM

## 2024-09-03 DIAGNOSIS — Z6825 Body mass index (BMI) 25.0-25.9, adult: Secondary | ICD-10-CM

## 2024-09-03 DIAGNOSIS — E669 Obesity, unspecified: Secondary | ICD-10-CM

## 2024-09-03 MED ORDER — TIRZEPATIDE-WEIGHT MANAGEMENT 2.5 MG/0.5ML ~~LOC~~ SOLN: 2.5000 mg | SUBCUTANEOUS | 0 refills | Status: DC

## 2024-09-03 NOTE — Progress Notes (Signed)
 WEIGHT SUMMARY AND BIOMETRICS  Weight Lost Since Last Visit: 3lb  Weight Gained Since Last Visit: 0   Vitals BP: (!) 141/77 Pulse Rate: 92 SpO2: 98 %   Anthropometric Measurements Height: 5' 8 (1.727 m) Weight: 172 lb (78 kg) BMI (Calculated): 26.16 Weight at Last Visit: 175lb Weight Lost Since Last Visit: 3lb Weight Gained Since Last Visit: 0 Starting Weight: 203lb Total Weight Loss (lbs): 31 lb (14.1 kg)   Body Composition  Body Fat %: 38.6 % Fat Mass (lbs): 66.8 lbs Muscle Mass (lbs): 100.6 lbs Total Body Water (lbs): 74.4 lbs Visceral Fat Rating : 7   Other Clinical Data Fasting: no Labs: no Today's Visit #: 31 Starting Date: 02/01/22    OBESITY Vanessa Sharp is here to discuss her progress with her obesity treatment plan along with follow-up of her obesity related diagnoses.    Nutrition Plan: the Category 2 plan - 75% adherence.  Current exercise: walking  Interim History:  She is down 3 lbs since her last visit.  Eating all of the food on the plan., Protein intake is as prescribed, Is not skipping meals, and Water intake is adequate.   Pharmacotherapy: Almedia is on Zepbound  2.5 mg SQ weekly Adverse side effects: None, except occasional nausea.  Hunger is moderately controlled.  Cravings are moderately controlled.  Assessment/Plan:   Polyphagia Tarahji endorses excessive hunger ,but her appetite is better controlled.  Medication(s): Zepbound  Effects of medication:  moderately controlled. Cravings are moderately controlled.   Plan: Medication(s): Zepbound  2.5 mg SQ weekly, 1 month supply with no refills.  Will increase water, protein and fiber to help assuage hunger.  Will minimize foods that have a high glucose index/load to minimize reactive hypoglycemia.   Vitamin D  Deficiency Vitamin D  is at goal of 50.  Most recent vitamin D   level was 69.8. She is on  prescription ergocalciferol  50,000 IU every 14 days. Lab Results  Component Value Date   VD25OH 69.8 02/22/2024   VD25OH 55.5 05/24/2023   VD25OH 43.8 11/02/2022    Plan: Continue prescription vitamin D  50,000 IU weekly.     Generalized Obesity: Current BMI BMI (Calculated): 26.16   Pharmacotherapy Plan Continue and refill  Zepbound  2.5 mg SQ weekly  Joleah is currently in the action stage of change. As such, her goal is to continue with weight loss efforts.  She has agreed to the Category 2 plan.  Exercise goals: All adults should avoid inactivity. Some physical activity is better than none, and adults who participate in any amount of physical activity gain some health benefits.  Behavioral modification strategies: increasing lean protein intake, no meal skipping, meal planning , increase water intake, better snacking choices, planning for success, increasing fiber rich foods, avoiding temptations, and keep healthy foods in the home.  Manya has agreed to follow-up with our clinic in 4 weeks.  Objective:   VITALS: Per patient if applicable, see vitals. GENERAL: Alert and in no acute distress. CARDIOPULMONARY: No increased WOB. Speaking in clear sentences.  PSYCH: Pleasant and cooperative. Speech normal rate and rhythm. Affect is appropriate. Insight and judgement are appropriate. Attention is focused, linear, and appropriate.  NEURO: Oriented as arrived to appointment on time with no prompting.   Attestation Statements:   This was prepared with the assistance of Engineer, Civil (consulting).  Occasional wrong-word or sound-a-like substitutions may have occurred due to the inherent limitations of voice recognition.   Clayborne Daring, DO

## 2024-09-30 ENCOUNTER — Ambulatory Visit: Admitting: Bariatrics

## 2024-10-24 ENCOUNTER — Encounter: Payer: Self-pay | Admitting: Bariatrics

## 2024-10-24 ENCOUNTER — Ambulatory Visit (INDEPENDENT_AMBULATORY_CARE_PROVIDER_SITE_OTHER): Admitting: Bariatrics

## 2024-10-24 VITALS — BP 133/64 | HR 93 | Ht 68.0 in | Wt 174.0 lb

## 2024-10-24 DIAGNOSIS — Z6826 Body mass index (BMI) 26.0-26.9, adult: Secondary | ICD-10-CM | POA: Diagnosis not present

## 2024-10-24 DIAGNOSIS — E7849 Other hyperlipidemia: Secondary | ICD-10-CM

## 2024-10-24 DIAGNOSIS — E785 Hyperlipidemia, unspecified: Secondary | ICD-10-CM

## 2024-10-24 DIAGNOSIS — Z6825 Body mass index (BMI) 25.0-25.9, adult: Secondary | ICD-10-CM

## 2024-10-24 DIAGNOSIS — R632 Polyphagia: Secondary | ICD-10-CM | POA: Diagnosis not present

## 2024-10-24 DIAGNOSIS — E669 Obesity, unspecified: Secondary | ICD-10-CM

## 2024-10-24 MED ORDER — WEGOVY 0.25 MG/0.5ML ~~LOC~~ SOAJ: 0.2500 mg | SUBCUTANEOUS | 0 refills | Status: AC

## 2024-10-24 NOTE — Progress Notes (Signed)
 "                                                                                                             WEIGHT SUMMARY AND BIOMETRICS  Weight Lost Since Last Visit: 0  Weight Gained Since Last Visit: 2lb   Vitals BP: 133/64 Pulse Rate: 93 SpO2: 100 %   Anthropometric Measurements Height: 5' 8 (1.727 m) Weight: 174 lb (78.9 kg) BMI (Calculated): 26.46 Weight at Last Visit: 172lb Weight Lost Since Last Visit: 0 Weight Gained Since Last Visit: 2lb Starting Weight: 203lb Total Weight Loss (lbs): 29 lb (13.2 kg)   Body Composition  Body Fat %: 39 % Fat Mass (lbs): 68.2 lbs Muscle Mass (lbs): 101.2 lbs Total Body Water (lbs): 72.8 lbs Visceral Fat Rating : 7   Other Clinical Data Fasting: no Labs: no Today's Visit #: 32 Starting Date: 02/01/22    OBESITY Vanessa Sharp is here to discuss her progress with her obesity treatment plan along with follow-up of her obesity related diagnoses.    Nutrition Plan: the Category 2 plan - 75% adherence.  Current exercise: none  Interim History:  She is up 2 lbs since her last visit. She was taking Zepbound  2.5 mg but states that it was too strong and that she wants to go back to Wegovy . She took Wegovy  and tolerated it well.  Not eating all of the food on the plan., Protein intake is as prescribed, and Water intake is adequate.   Pharmacotherapy: Vanessa Sharp is on Zepbound  2.5 mg SQ weekly Adverse side effects: None Hunger is well controlled. Hunger increases when not taking the medication.  Cravings are well controlled.  Assessment/Plan:   Vanessa Sharp endorses excessive hunger.  Medication(s): Zepbound  2.5 mg Effects of medication:  well controlled. Cravings are well controlled.   Plan: Medication(s): Will switch back to Wegovy  0.25 mg SQ weekly Will increase water, protein and fiber to help assuage hunger.  Will minimize foods that have a high glucose index/load to minimize reactive hypoglycemia.  We  will journal on a regular basis both her calories and her protein.  Hyperlipidemia LDL is at goal.  Her triglycerides are slightly elevated at 176 Medication(s): none Cardiovascular risk factors: obesity (BMI >= 30 kg/m2) and sedentary lifestyle  Lab Results  Component Value Date   CHOL 169 02/22/2024   HDL 53 02/22/2024   LDLCALC 86 02/22/2024   TRIG 176 (H) 02/22/2024   CHOLHDL 2.8 11/09/2018   Lab Results  Component Value Date   ALT 12 02/22/2024   AST 15 02/22/2024   ALKPHOS 74 02/22/2024   BILITOT 0.6 02/22/2024   The ASCVD Risk score (Arnett DK, et al., 2019) failed to calculate for the following reasons:   Risk score cannot be calculated because patient has a medical history suggesting prior/existing ASCVD   * - Cholesterol units were assumed  Plan:  Continue statin.  Information sheet on healthy vs unhealthy fats.  Will avoid all trans fats.  Will read labels Will minimize saturated fats except the following: low  fat meats in moderation, diary, and limited dark chocolate.  Will do fasting labs at her next visit Will do an ICD test at her next visit to check metabolism.     Generalized Obesity: Current BMI BMI (Calculated): 26.46   Pharmacotherapy Plan Change to  Wegovy  0.25 mg SQ weekly  Vanessa Sharp is currently in the action stage of change. As such, her goal is to continue with weight loss efforts.  She has agreed to the Category 2 plan.  Exercise goals: All adults should avoid inactivity. Some physical activity is better than none, and adults who participate in any amount of physical activity gain some health benefits.  Behavioral modification strategies: increasing lean protein intake, decreasing simple carbohydrates , no meal skipping, meal planning , increase water intake, better snacking choices, planning for success, increasing vegetables, avoiding temptations, keep healthy foods in the home, and work on smaller portions.  Vanessa Sharp has agreed to follow-up  with our clinic in 4 weeks.    Objective:   VITALS: Per patient if applicable, see vitals. GENERAL: Alert and in no acute distress. CARDIOPULMONARY: No increased WOB. Speaking in clear sentences.  PSYCH: Pleasant and cooperative. Speech normal rate and rhythm. Affect is appropriate. Insight and judgement are appropriate. Attention is focused, linear, and appropriate.  NEURO: Oriented as arrived to appointment on time with no prompting.   Attestation Statements:   This was prepared with the assistance of Engineer, Civil (consulting).  Occasional wrong-word or sound-a-like substitutions may have occurred due to the inherent limitations of voice recognition.   Clayborne Daring, DO    "

## 2024-11-19 ENCOUNTER — Ambulatory Visit: Admitting: Bariatrics
